# Patient Record
Sex: Male | Born: 1955
Health system: Southern US, Community
[De-identification: ages and names within clinical notes are randomized; demographics above are authoritative.]

## PROBLEM LIST (undated history)

## (undated) DIAGNOSIS — I10 Essential (primary) hypertension: Secondary | ICD-10-CM

## (undated) HISTORY — DX: Essential (primary) hypertension: I10

---

## 2005-04-26 ENCOUNTER — Ambulatory Visit: Payer: Self-pay | Admitting: Internal Medicine

## 2010-10-26 ENCOUNTER — Ambulatory Visit (INDEPENDENT_AMBULATORY_CARE_PROVIDER_SITE_OTHER): Payer: Managed Care, Other (non HMO) | Admitting: Internal Medicine

## 2010-10-26 ENCOUNTER — Encounter: Payer: Self-pay | Admitting: Internal Medicine

## 2010-10-26 DIAGNOSIS — M79606 Pain in leg, unspecified: Secondary | ICD-10-CM

## 2010-10-26 DIAGNOSIS — M79609 Pain in unspecified limb: Secondary | ICD-10-CM

## 2010-10-26 DIAGNOSIS — I10 Essential (primary) hypertension: Secondary | ICD-10-CM

## 2010-10-26 MED ORDER — DICLOFENAC POTASSIUM 50 MG PO TABS: ORAL_TABLET | ORAL | Status: DC

## 2010-10-26 MED ORDER — AMLODIPINE BESYLATE 5 MG PO TABS: 5.0000 mg | ORAL_TABLET | Freq: Every day | ORAL | Status: DC

## 2010-10-26 NOTE — Progress Notes (Signed)
  Subjective:    Patient ID: Jamie Sparks, male    DOB: May 30, 1955, 55 y.o.   MRN: [id number]  HPI Pt presents to clinic for evaluation of possible MSK injury. Is an avid cyclist and has had somewhat chronic intermittent pain located right upper hamstring area. No injury or trauma. Continues to cycle without pain but off exercise days has mild pain. Does not interfere in daily activities. Has attempted herbal medication to help with inflammation.  BP reviewed as elevated without ha, dizziness, cp or neurologic deficit. Has not required anti-htn medication in the past but has noted diastolics consistently in mid to upper 90's. Notes extensive family hx of HTN. No alleviating or exacerbating factors. No other complaints.  Reviewed pmh, psh, medications, allergies, soc hx and fam hx.    Review of Systems  Constitutional: Negative for fever, chills and fatigue.  Eyes: Negative for redness and itching.  Respiratory: Negative for cough and shortness of breath.   Cardiovascular: Negative for chest pain and palpitations.  Musculoskeletal: Positive for myalgias. Negative for back pain, arthralgias and gait problem.  Skin: Negative for color change and rash.  Neurological: Negative for dizziness, seizures, syncope, weakness and headaches.  All other systems reviewed and are negative.       Objective:   Physical Exam  Nursing note and vitals reviewed. Constitutional: He appears well-developed and well-nourished. No distress.  HENT:  Head: Normocephalic and atraumatic.  Right Ear: External ear normal.  Left Ear: External ear normal.  Nose: Nose normal.  Eyes: Conjunctivae are normal. Right eye exhibits no discharge. Left eye exhibits no discharge. No scleral icterus.  Neck: Neck supple. No JVD present.  Musculoskeletal:       FROM right hip. No reproduction of pain with extension/flexion, int/ext rotation. NT posterior gluteal and hamstring area. Gait nl  Neurological: He is alert.    Skin: Skin is warm and dry. No rash noted. He is not diaphoretic. No erythema.  Psychiatric: He has a normal mood and affect.          Assessment & Plan:

## 2010-10-26 NOTE — Assessment & Plan Note (Signed)
Attempt voltaren with food and no other nsaid. Recommend hiatus from cycling temporarily to facilitate healing. Consider PT or orthopedics if sx's persist.

## 2010-10-26 NOTE — Assessment & Plan Note (Signed)
Asx. ?phase shift. Recommend medication with norvasc 5mg  po qd. Monitor bp as outpt and schedule close follow up in clinic for re-evaluation. At follow up discuss labs.

## 2010-11-04 ENCOUNTER — Ambulatory Visit (INDEPENDENT_AMBULATORY_CARE_PROVIDER_SITE_OTHER): Payer: Managed Care, Other (non HMO) | Admitting: Internal Medicine

## 2010-11-04 ENCOUNTER — Encounter: Payer: Self-pay | Admitting: Internal Medicine

## 2010-11-04 VITALS — BP 180/120 | Temp 98.1°F | Wt 220.0 lb

## 2010-11-04 DIAGNOSIS — M79609 Pain in unspecified limb: Secondary | ICD-10-CM

## 2010-11-04 DIAGNOSIS — I10 Essential (primary) hypertension: Secondary | ICD-10-CM

## 2010-11-04 DIAGNOSIS — M79606 Pain in leg, unspecified: Secondary | ICD-10-CM

## 2010-11-04 LAB — CBC WITH DIFFERENTIAL/PLATELET
Basophils Relative: 0.5 % (ref 0.0–3.0)
Eosinophils Absolute: 0 10*3/uL (ref 0.0–0.7)
Hemoglobin: 14.1 g/dL (ref 13.0–17.0)
MCHC: 33.8 g/dL (ref 30.0–36.0)
MCV: 88.9 fl (ref 78.0–100.0)
Monocytes Absolute: 0.5 10*3/uL (ref 0.1–1.0)
Neutro Abs: 3.5 10*3/uL (ref 1.4–7.7)
RBC: 4.7 Mil/uL (ref 4.22–5.81)
RDW: 13.2 % (ref 11.5–14.6)

## 2010-11-04 LAB — POCT URINALYSIS DIPSTICK
Blood, UA: NEGATIVE
Nitrite, UA: NEGATIVE
Protein, UA: NEGATIVE
Urobilinogen, UA: 0.2
pH, UA: 7

## 2010-11-04 LAB — TSH: TSH: 1.22 u[IU]/mL (ref 0.35–5.50)

## 2010-11-04 LAB — BASIC METABOLIC PANEL
BUN: 17 mg/dL (ref 6–23)
Calcium: 9.1 mg/dL (ref 8.4–10.5)
Creatinine, Ser: 0.8 mg/dL (ref 0.4–1.5)
GFR: 107.7 mL/min (ref >60.00)

## 2010-11-04 MED ORDER — AMLODIPINE BESY-BENAZEPRIL HCL 10-20 MG PO CAPS: 1.0000 | ORAL_CAPSULE | Freq: Every day | ORAL | Status: DC

## 2010-11-06 NOTE — Progress Notes (Signed)
  Subjective:    Patient ID: Jamie Sparks, male    DOB: 1955-05-15, 55 y.o.   MRN: [id number]  HPI Patient presents to clinic for evaluation of elevated blood pressure. Seen recently with musculoskeletal pain located low back versus upper hamstring. Occurs with exercise as he is an avid cyclist. Attempted anti-inflammatory and stop cycling temporarily. Pain resolved after resuming cycling has resumed the pain mildly. At last visit blood pressure was noted to be significant elevated however exam also complicated by a phase shift. Recalls elevated blood pressures consistent hypertension for many years. Began Norvasc 5 mg at last visit and tolerates without lower extremity swelling. Home monitoring initially demonstrated normotensive numbers such as 125/80 however quickly returned to elevated pressures of approximately 180/102. Continues to remain asymptomatic without headache, dizziness, neurologic deficit chest pain or shortness of breath. No exacerbating or alleviating factors. No other complaints.     Review of Systems  Constitutional: Negative for activity change, fatigue and unexpected weight change.  Respiratory: Negative for cough and shortness of breath.   Cardiovascular: Negative for chest pain and palpitations.  Neurological: Negative for dizziness, weakness and headaches.       Objective:   Physical Exam  Nursing note and vitals reviewed. Constitutional: He appears well-developed and well-nourished. No distress.  HENT:  Head: Normocephalic and atraumatic.  Eyes: Conjunctivae are normal. No scleral icterus.  Neurological: He is alert.  Skin: Skin is warm and dry. He is not diaphoretic.  Psychiatric: He has a normal mood and affect.          Assessment & Plan:

## 2010-11-06 NOTE — Assessment & Plan Note (Signed)
Improved with reduction of exercise. Discussed physical therapy and patient states he will consider.

## 2010-11-06 NOTE — Assessment & Plan Note (Signed)
Rechecked myself 180/100. Remains with poor control. Change Norvasc to Lotrel. Obtain CBC, Chem-7, TSH. Schedule close followup in 2 weeks or sooner if necessary. Consider secondary workup if remains under poor control.

## 2010-11-08 ENCOUNTER — Telehealth: Payer: Self-pay

## 2010-11-08 NOTE — Telephone Encounter (Signed)
Left message on personally identified voicemail to notify pt labs nl

## 2010-11-08 NOTE — Telephone Encounter (Signed)
Message copied by Beverely Low on Tue Nov 08, 2010  4:08 PM ------      Message from: Staci Righter      Created: Tue Nov 08, 2010  8:25 AM       Labs nl

## 2010-11-14 ENCOUNTER — Encounter: Payer: Self-pay | Admitting: Internal Medicine

## 2010-11-14 ENCOUNTER — Ambulatory Visit (INDEPENDENT_AMBULATORY_CARE_PROVIDER_SITE_OTHER): Payer: Managed Care, Other (non HMO) | Admitting: Internal Medicine

## 2010-11-14 DIAGNOSIS — I10 Essential (primary) hypertension: Secondary | ICD-10-CM

## 2010-11-14 MED ORDER — AMLODIPINE BESY-BENAZEPRIL HCL 10-40 MG PO CAPS: 1.0000 | ORAL_CAPSULE | Freq: Every day | ORAL | Status: DC

## 2010-11-14 NOTE — Progress Notes (Signed)
  Subjective:    Patient ID: Jamie Sparks, male    DOB: 12/12/1955, 55 y.o.   MRN: [id number]  HPI Pt presents to clinic for followup of hypertension. After initial lack of improvement with Norvasc patient was changed to Lotrel. Has only slight nonproductive cough without lip swelling. Overall feels improved. Is compliant with medication. Notes recent blood pressure of 140/85. Blood pressure elevated today without symptoms of headache dizziness chest pain or shortness of breath. Rechecked to be 160/96 right arm. No leg swelling. No other complaints  Reviewed past medical history, medications and allergies.  Review of Systems see history of present illness     Objective:   Physical Exam  Nursing note and vitals reviewed. Constitutional: He appears well-developed and well-nourished.  HENT:  Head: Normocephalic and atraumatic.  Eyes: Conjunctivae are normal. No scleral icterus.  Neurological: He is alert.  Skin: Skin is warm and dry.  Psychiatric: He has a normal mood and affect.          Assessment & Plan:

## 2010-11-14 NOTE — Assessment & Plan Note (Signed)
Improved but suboptimal control. Increase Lotrel 10/40 milligrams by mouth daily. Discussed Chem-7 and patient defers until next visit. Followup in one month or sooner if necessary. Continue outpatient monitoring of blood pressure

## 2010-12-19 ENCOUNTER — Encounter: Payer: Self-pay | Admitting: Internal Medicine

## 2010-12-19 ENCOUNTER — Ambulatory Visit (INDEPENDENT_AMBULATORY_CARE_PROVIDER_SITE_OTHER): Payer: Managed Care, Other (non HMO) | Admitting: Internal Medicine

## 2010-12-19 VITALS — BP 132/80 | HR 58 | Temp 98.0°F | Resp 16 | Wt 221.0 lb

## 2010-12-19 DIAGNOSIS — Z79899 Other long term (current) drug therapy: Secondary | ICD-10-CM

## 2010-12-19 DIAGNOSIS — M79609 Pain in unspecified limb: Secondary | ICD-10-CM

## 2010-12-19 DIAGNOSIS — I10 Essential (primary) hypertension: Secondary | ICD-10-CM

## 2010-12-19 DIAGNOSIS — M79606 Pain in leg, unspecified: Secondary | ICD-10-CM

## 2010-12-19 LAB — BASIC METABOLIC PANEL
BUN: 19 mg/dL (ref 6–23)
Chloride: 106 mEq/L (ref 96–112)
Creat: 1.03 mg/dL (ref 0.50–1.35)

## 2010-12-19 MED ORDER — DICLOFENAC SODIUM 75 MG PO TBEC: 75.0000 mg | DELAYED_RELEASE_TABLET | Freq: Two times a day (BID) | ORAL | Status: DC | PRN

## 2010-12-21 NOTE — Progress Notes (Signed)
  Subjective:    Patient ID: Jamie Sparks, male    DOB: May 30, 1955, 55 y.o.   MRN: [id number]  HPI patient presents to clinic for followup of hypertension. Attempted higher dose of Lotrel but developed nausea and upset stomach. Self decreased the dose back to 10/20 and side effects resolved. Blood pressure has been normotensive since that time. Has intermittent right low back pain that occurs with regular exercise. No radicular symptoms. Is much improved from initial evaluation. Helped by anti-inflammatory and is interested in possible higher dose. No abdominal pain or GI upset. No other complaints.  Reviewed past medical history, medications and allergies    Review of Systems see history of present illness     Objective:   Physical Exam  Nursing note and vitals reviewed. Constitutional: He appears well-developed and well-nourished. No distress.  HENT:  Head: Normocephalic and atraumatic.  Right Ear: External ear normal.  Left Ear: External ear normal.  Eyes: Conjunctivae are normal. No scleral icterus.  Cardiovascular: Normal rate, regular rhythm and normal heart sounds.  Exam reveals no gallop and no friction rub.   No murmur heard. Pulmonary/Chest: Effort normal and breath sounds normal. No respiratory distress. He has no wheezes. He has no rales.  Neurological: He is alert.  Skin: Skin is warm and dry. He is not diaphoretic.  Psychiatric: He has a normal mood and affect.          Assessment & Plan:

## 2010-12-21 NOTE — Assessment & Plan Note (Signed)
Normotensive. Continue current regimen. Obtain Chem-7

## 2010-12-21 NOTE — Assessment & Plan Note (Signed)
Increase dose of Voltaren. Take with food and no other anti-inflammatories as needed.

## 2011-01-27 ENCOUNTER — Encounter: Payer: Self-pay | Admitting: Family

## 2011-01-27 ENCOUNTER — Ambulatory Visit (INDEPENDENT_AMBULATORY_CARE_PROVIDER_SITE_OTHER): Payer: Managed Care, Other (non HMO) | Admitting: Family

## 2011-01-27 VITALS — BP 168/98 | HR 60 | Temp 97.9°F | Resp 16 | Ht 72.01 in | Wt 226.0 lb

## 2011-01-27 DIAGNOSIS — R011 Cardiac murmur, unspecified: Secondary | ICD-10-CM

## 2011-01-27 DIAGNOSIS — M79609 Pain in unspecified limb: Secondary | ICD-10-CM

## 2011-01-27 DIAGNOSIS — M79606 Pain in leg, unspecified: Secondary | ICD-10-CM

## 2011-01-27 DIAGNOSIS — I10 Essential (primary) hypertension: Secondary | ICD-10-CM

## 2011-01-27 MED ORDER — MELOXICAM 7.5 MG PO TABS: 7.5000 mg | ORAL_TABLET | Freq: Every day | ORAL | Status: DC

## 2011-01-27 NOTE — Progress Notes (Signed)
  Subjective:    Patient ID: Jamie Sparks, male    DOB: 1956-01-18, 55 y.o.   MRN: [id number]  HPI  Mr.  Jamie Sparks is a 55 yr old male who presents today with chief complaint of pain in the right hamstring. He is unable to tell me when this pain started, but he does tell me that he had some low back pain in the spring which has resolved. Reports that he rode 35 miles on his bike earlier this week. This is not unusual for him.  Heating pad improves his pain but he notes that after he gets up and moves around it "stiffens up." Denies previous history of the similar symptoms.    HTN- he reports that he has been checking his blood pressure regularly at home. It has reportedly been running 130-s 140's systolic. He reports that he took his lotrel today. Review of Systems  Respiratory: Negative for shortness of breath.   Cardiovascular: Negative for chest pain and leg swelling.   See HPI  Past Medical History  Diagnosis Date  . Hypertension     History   Social History  . Marital Status: Married    Spouse Name: N/A    Number of Children: N/A  . Years of Education: N/A   Occupational History  . Not on file.   Social History Main Topics  . Smoking status: Never Smoker   . Smokeless tobacco: Not on file  . Alcohol Use: Yes  . Drug Use: No  . Sexually Active:    Other Topics Concern  . Not on file   Social History Narrative  . No narrative on file    No past surgical history on file.  Family History  Problem Relation Age of Onset  . Hypertension Mother     No Known Allergies  Current Outpatient Prescriptions on File Prior to Visit  Medication Sig Dispense Refill  . amLODipine-benazepril (LOTREL) 10-20 MG per capsule Take 1 capsule by mouth daily.          BP 168/98  Pulse 60  Temp(Src) 97.9 F (36.6 C) (Oral)  Resp 16  Ht 6' 0.01" (1.829 m)  Wt 226 lb (102.513 kg)  BMI 30.64 kg/m2       Objective:   Physical Exam  Constitutional: He appears  well-developed and well-nourished.  Cardiovascular: Normal rate and regular rhythm.        Grade II/VI systolic high pitched murmur   Pulmonary/Chest: Effort normal and breath sounds normal. No respiratory distress. He has no wheezes. He has no rales. He exhibits no tenderness.  Musculoskeletal: Normal range of motion. He exhibits no edema.       Right upper leg: He exhibits tenderness. He exhibits no swelling and no edema.  Neurological: He exhibits normal muscle tone.          Assessment & Plan:   BP Readings from Last 3 Encounters:  01/27/11 168/98  12/19/10 132/80  11/14/10 152/98

## 2011-01-27 NOTE — Assessment & Plan Note (Signed)
New, sounds like murmur of AS. Will send for 2-D echo to further evaluate. Pt is clinically compensated.

## 2011-01-27 NOTE — Assessment & Plan Note (Signed)
I will refer to Dr. Pearletha [facility] sports medicine for further evaluation. Trial of Mobic in the meantime.

## 2011-01-27 NOTE — Patient Instructions (Addendum)
You will be contacted about your echocardiogram by cardiology and sports medicine. Call if you develop readings >150/90. Follow up in 1 month.

## 2011-01-27 NOTE — Assessment & Plan Note (Addendum)
Deteriorated- 130/73, 140/80's last week. I suggested that we add an additional agent to his regimen. He refuses.  I did repeat his BP in the office and confirmed the reading as listed.  I advised him to check his BP daily at home and call if >150/90 and to follow up with Dr. Rodena Medin in 1 month.

## 2011-02-01 ENCOUNTER — Ambulatory Visit (INDEPENDENT_AMBULATORY_CARE_PROVIDER_SITE_OTHER): Payer: Managed Care, Other (non HMO) | Admitting: Family Medicine

## 2011-02-01 VITALS — BP 151/98 | Ht 72.0 in | Wt 205.0 lb

## 2011-02-01 DIAGNOSIS — M549 Dorsalgia, unspecified: Secondary | ICD-10-CM

## 2011-02-01 DIAGNOSIS — M5416 Radiculopathy, lumbar region: Secondary | ICD-10-CM

## 2011-02-01 DIAGNOSIS — IMO0002 Reserved for concepts with insufficient information to code with codable children: Secondary | ICD-10-CM

## 2011-02-01 MED ORDER — PREDNISONE (PAK) 10 MG PO TABS: 10.0000 mg | ORAL_TABLET | Freq: Every day | ORAL | Status: AC

## 2011-02-01 NOTE — Patient Instructions (Signed)
Your symptoms indicate hamstring strain/spasms and possible lumbar radiculopathy (an irritated nerve in your low back). A prednisone dose pack is the best option for immediate relief and may be prescribed with transition to an anti-inflammatory like aleve (2 tabs twice a day with food) or meloxicam (if you do not have stomach or kidney issues). Stay as active as possible. Physical therapy has been shown to be helpful as well - start this for strengthening of hamstring and core muscles, home exercise program, modalities. Strengthening of low back muscles, abdominal musculature are key for long term pain relief. Ok to continue to ride while you are getting over this - if pain is > 3/10 while riding, you should back off. Be cognizant of your posture when you are on the bike. If not improving, will consider further imaging (x-rays and MRI) of lumbar spine.

## 2011-02-02 ENCOUNTER — Encounter: Payer: Self-pay | Admitting: Family Medicine

## 2011-02-02 DIAGNOSIS — M549 Dorsalgia, unspecified: Secondary | ICD-10-CM | POA: Insufficient documentation

## 2011-02-02 NOTE — Progress Notes (Signed)
  Subjective:    Patient ID: Jamie Sparks, male    DOB: February 25, 1956, 55 y.o.   MRN: [id number]  PCP: Dr. Rodena Medin  HPI 55 yo M here for right hamstring/back pain.  Patient reports starting in May he bought a new road bike (is a cyclist) and shortly after using this on 25-30 mile rides he developed mild right sided low back pain. No associated numbness or tingling. Didn't radiate down leg at the time. Over several weeks began to develop posterolateral right leg pain in hamstring and lateral leg pain sometimes going down past knee. Has been using heat and ice, mobic for pain/inflammation. Has not done any specific PT. No prior workup of back or leg issues. Feels like he gets a knot in hamstring that eases throughout the day and throughout his rides. No bowel/bladder dysfunction.  Past Medical History  Diagnosis Date  . Hypertension     Current Outpatient Prescriptions on File Prior to Visit  Medication Sig Dispense Refill  . amLODipine-benazepril (LOTREL) 10-20 MG per capsule Take 1 capsule by mouth daily.        . meloxicam (MOBIC) 7.5 MG tablet Take 1 tablet (7.5 mg total) by mouth daily.  15 tablet  0    History reviewed. No pertinent past surgical history.  No Known Allergies  History   Social History  . Marital Status: Married    Spouse Name: N/A    Number of Children: N/A  . Years of Education: N/A   Occupational History  . Not on file.   Social History Main Topics  . Smoking status: Never Smoker   . Smokeless tobacco: Not on file  . Alcohol Use: Yes  . Drug Use: No  . Sexually Active:    Other Topics Concern  . Not on file   Social History Narrative  . No narrative on file    Family History  Problem Relation Age of Onset  . Hypertension Mother     BP 151/98  Ht 6' (1.829 m)  Wt 205 lb (92.987 kg)  BMI 27.80 kg/m2  Review of Systems See HPI above.    Objective:   Physical Exam Gen: NAD  Back/R leg: No gross deformity, scoliosis,  defect/atrophy. No paraspinal TTP .  No midline or bony TTP.  Mod TTP distal lateral hamstring without palpable defect.  Mild TTP proximal lateral hamstring also. FROM without pain in back. Strength LEs 4/5 with right hip abduction and 4/5 with pain on hamstring testing (knee flexion at 90 degrees).  5/5 all othermuscle groups.   2+ MSRs in patellar tendons, 1+ achilles tendons, equal bilaterally. Negative SLRs. Sensation intact to light touch bilaterally. Negative logroll bilateral hips Negative fabers and piriformis stretches. No leg length inequality    Assessment & Plan:  1. Low back/right leg pain - currently without any back pain and back exam is normal.  He does have weakness in hip abduction and hamstring flexion that could be related to irritated lumbar nerve or hamstring strain/weakness from overuse.  Start PT for both issues.  Will trial prednisone dose pack then switch back to meloxicam.  Ok to cycle in meantime unless pain is > 3/10 but tends to improve with activity.  F/u in 4-6 weeks for recheck.  If not improving as expected, would consider MRI of lumbar spine.  Of note he has also gone for bike fitting given this was associated with a new bike.

## 2011-02-02 NOTE — Assessment & Plan Note (Signed)
currently without any back pain and back exam is normal.  He does have weakness in hip abduction and hamstring flexion that could be related to irritated lumbar nerve or hamstring strain/weakness from overuse.  Start PT for both issues.  Will trial prednisone dose pack then switch back to meloxicam.  Ok to cycle in meantime unless pain is > 3/10 but tends to improve with activity.  F/u in 4-6 weeks for recheck.  If not improving as expected, would consider MRI of lumbar spine.  Of note he has also gone for bike fitting given this was associated with a new bike.

## 2011-02-06 ENCOUNTER — Ambulatory Visit: Payer: Managed Care, Other (non HMO) | Admitting: Family Medicine

## 2011-02-07 ENCOUNTER — Telehealth: Payer: Self-pay | Admitting: Family

## 2011-02-07 NOTE — Telephone Encounter (Signed)
Clarification: he does not want this apt?

## 2011-02-07 NOTE — Telephone Encounter (Signed)
Call to patient  With Echo appt.   Patient states he does not this appt   He will see his other doctor next week .

## 2011-02-08 ENCOUNTER — Telehealth: Payer: Self-pay | Admitting: Family

## 2011-02-08 NOTE — Telephone Encounter (Signed)
Received flag from cardiology that pt has cancelled the echo.

## 2011-02-21 ENCOUNTER — Other Ambulatory Visit (HOSPITAL_COMMUNITY): Payer: Managed Care, Other (non HMO)

## 2011-03-01 ENCOUNTER — Encounter: Payer: Self-pay | Admitting: Family Medicine

## 2011-03-01 ENCOUNTER — Ambulatory Visit (INDEPENDENT_AMBULATORY_CARE_PROVIDER_SITE_OTHER): Payer: Managed Care, Other (non HMO) | Admitting: Family Medicine

## 2011-03-01 VITALS — BP 157/97 | HR 66 | Temp 97.6°F | Ht 72.0 in | Wt 205.0 lb

## 2011-03-01 DIAGNOSIS — M549 Dorsalgia, unspecified: Secondary | ICD-10-CM

## 2011-03-01 NOTE — Assessment & Plan Note (Signed)
2/2 lumbar radiculopathy - significant improvement with prednisone and his own exercise program - marked improvement in exam as well.  Continue with home exercises regularly for 2 more weeks then back down to 3 days a week.  Aleve as needed.  F/u prn.

## 2011-03-01 NOTE — Progress Notes (Signed)
Subjective:    Patient ID: Jamie Sparks, male    DOB: 02-15-1956, 55 y.o.   MRN: [id number]  PCP: Dr. Rodena Medin  HPI  55 yo M here for f/u right hamstring/back pain.  9/26: Patient reports starting in May he bought a new road bike (is a cyclist) and shortly after using this on 25-30 mile rides he developed mild right sided low back pain. No associated numbness or tingling. Didn't radiate down leg at the time. Over several weeks began to develop posterolateral right leg pain in hamstring and lateral leg pain sometimes going down past knee. Has been using heat and ice, mobic for pain/inflammation. Has not done any specific PT. No prior workup of back or leg issues. Feels like he gets a knot in hamstring that eases throughout the day and throughout his rides. No bowel/bladder dysfunction.  10/24: Patient took prednisone dose pack and had significant improvement in symptoms. Did not go to physical therapy but has been really focusing on lower extremity strengthening, especially of right hamstring and hip abduction where he had deficits at last visit. Took some aleve last week. Reports no pain currently and 100% improved. Now only with occasional symptoms that he notes go through lateral right leg without numbness/tingling. No other concerns. Able to cycle for 20+ miles now without pain.  Past Medical History  Diagnosis Date  . Hypertension     Current Outpatient Prescriptions on File Prior to Visit  Medication Sig Dispense Refill  . amLODipine-benazepril (LOTREL) 10-20 MG per capsule Take 1 capsule by mouth daily.        . meloxicam (MOBIC) 7.5 MG tablet Take 1 tablet (7.5 mg total) by mouth daily.  15 tablet  0    History reviewed. No pertinent past surgical history.  No Known Allergies  History   Social History  . Marital Status: Married    Spouse Name: N/A    Number of Children: N/A  . Years of Education: N/A   Occupational History  . Not on file.   Social  History Main Topics  . Smoking status: Never Smoker   . Smokeless tobacco: Not on file  . Alcohol Use: Yes  . Drug Use: No  . Sexually Active: Not on file   Other Topics Concern  . Not on file   Social History Narrative  . No narrative on file    Family History  Problem Relation Age of Onset  . Hypertension Mother     BP 157/97  Pulse 66  Temp(Src) 97.6 F (36.4 C) (Oral)  Ht 6' (1.829 m)  Wt 205 lb (92.987 kg)  BMI 27.80 kg/m2  Review of Systems  See HPI above.    Objective:   Physical Exam  Gen: NAD  Back/R leg: No gross deformity, scoliosis, defect/atrophy. No paraspinal TTP .  No midline or bony TTP.  No longer tender in hamstring. FROM without pain in back. Strength 5/5 with all lower extremity muscle testing including hip abduction and knee flexion.   2+ MSRs in patellar tendons, 1+ achilles tendons, equal bilaterally. Negative SLRs. Sensation intact to light touch bilaterally. Negative logroll bilateral hips Negative fabers and piriformis stretches.    Assessment & Plan:  1. Low back/right leg pain - 2/2 lumbar radiculopathy - significant improvement with prednisone and his own exercise program - marked improvement in exam as well.  Continue with home exercises regularly for 2 more weeks then back down to 3 days a week.  Aleve as needed.  F/u prn.

## 2011-03-20 ENCOUNTER — Ambulatory Visit: Payer: Managed Care, Other (non HMO) | Admitting: Internal Medicine

## 2011-04-21 ENCOUNTER — Encounter: Payer: Self-pay | Admitting: Internal Medicine

## 2011-04-21 ENCOUNTER — Ambulatory Visit (INDEPENDENT_AMBULATORY_CARE_PROVIDER_SITE_OTHER): Payer: Managed Care, Other (non HMO) | Admitting: Internal Medicine

## 2011-04-21 VITALS — BP 132/90 | HR 74 | Temp 98.1°F | Resp 18 | Wt 229.0 lb

## 2011-04-21 DIAGNOSIS — I10 Essential (primary) hypertension: Secondary | ICD-10-CM

## 2011-04-21 DIAGNOSIS — R011 Cardiac murmur, unspecified: Secondary | ICD-10-CM

## 2011-04-21 NOTE — Patient Instructions (Signed)
Please schedule chem7 (v58.69) and lipid (401.9) prior to next visit

## 2011-04-22 NOTE — Assessment & Plan Note (Signed)
Asx. Do not recall 3/6 sm on previous exams.recommend echocardiogram.

## 2011-04-22 NOTE — Progress Notes (Signed)
  Subjective:    Patient ID: Jamie Sparks, male    DOB: [birth year], 55 y.o.   MRN: [id number]  HPI Pt presents to clinic for followup of multiple medical problems. Notes home BP under avg control. Tolerates current dose of lotrel without leg swelling or cough.   Past Medical History  Diagnosis Date  . Hypertension    No past surgical history on file.  reports that he has never smoked. He has never used smokeless tobacco. He reports that he drinks alcohol. He reports that he does not use illicit drugs. family history includes Hypertension in his mother. No Known Allergies    Review of Systems see hpi     Objective:   Physical Exam  Nursing note and vitals reviewed. Constitutional: He appears well-developed and well-nourished. No distress.  HENT:  Head: Normocephalic and atraumatic.  Right Ear: External ear normal.  Left Ear: External ear normal.  Eyes: Conjunctivae are normal. No scleral icterus.  Cardiovascular: Normal rate and regular rhythm.  Exam reveals no gallop and no friction rub.   Murmur heard.  Systolic murmur is present with a grade of 3/6  Neurological: He is alert.  Skin: He is not diaphoretic.  Psychiatric: He has a normal mood and affect.           Assessment & Plan:

## 2011-04-22 NOTE — Assessment & Plan Note (Signed)
avg control. Difficulty tolerating higher dose of lotrel. Plans to focus on wt loss. Obtain chem7 prior to next visit

## 2011-05-12 ENCOUNTER — Ambulatory Visit (HOSPITAL_COMMUNITY): Payer: Managed Care, Other (non HMO) | Attending: Cardiology | Admitting: Radiology

## 2011-05-12 DIAGNOSIS — R011 Cardiac murmur, unspecified: Secondary | ICD-10-CM | POA: Insufficient documentation

## 2011-05-12 DIAGNOSIS — I10 Essential (primary) hypertension: Secondary | ICD-10-CM | POA: Insufficient documentation

## 2011-06-12 ENCOUNTER — Telehealth: Payer: Self-pay | Admitting: Internal Medicine

## 2011-06-12 MED ORDER — AMLODIPINE BESY-BENAZEPRIL HCL 10-20 MG PO CAPS: 1.0000 | ORAL_CAPSULE | Freq: Every day | ORAL | Status: DC

## 2011-06-12 NOTE — Telephone Encounter (Signed)
Refill sent to pharmacy.   

## 2011-06-13 ENCOUNTER — Telehealth: Payer: Self-pay | Admitting: Internal Medicine

## 2011-06-13 NOTE — Telephone Encounter (Signed)
Refill completed yesterday. See previous documentation.

## 2011-06-22 ENCOUNTER — Encounter: Payer: Self-pay | Admitting: Family Medicine

## 2011-06-22 ENCOUNTER — Ambulatory Visit (INDEPENDENT_AMBULATORY_CARE_PROVIDER_SITE_OTHER): Payer: Managed Care, Other (non HMO) | Admitting: Family Medicine

## 2011-06-22 DIAGNOSIS — M25579 Pain in unspecified ankle and joints of unspecified foot: Secondary | ICD-10-CM

## 2011-06-22 DIAGNOSIS — M25551 Pain in right hip: Secondary | ICD-10-CM

## 2011-06-22 DIAGNOSIS — M25571 Pain in right ankle and joints of right foot: Secondary | ICD-10-CM

## 2011-06-22 DIAGNOSIS — M25559 Pain in unspecified hip: Secondary | ICD-10-CM

## 2011-06-22 NOTE — Patient Instructions (Signed)
Your ankle pain is due to peroneal tendinopathy - overuse/strain of the two muscles/tendons that are on the outside of the ankle. Start home exercise program - 3 sets of 10 calf raises and external rotation with theraband. Ice after activities for 15 minutes. Tylenol and/or aleve as needed for pain. Consider over-the-counter orthotics but they have to have a plastic or more firm arch support to them (these typically run about 15 bucks). If not improving can consider formal physical therapy or nitro patches for this.  Your right hip pain is within external rotators of your hip (glut medius, gemelli, obturator, quadratus). Do hip abduction and standing hip rotation exercises 3 sets of 10 once a day - add ankle weight if these become too easy. These are relatively difficult to isolate so I want PT to evaluate you and give you some additional exercises to get you better. Ice or heat as needed (whichever feels better to you). Tennis ball massage to area when possible and on long car rides. Follow up with me in 1 month to 6 weeks for both of these.  No restrictions on activities as a result of these though. I don't think these will be longstanding issues for you.

## 2011-06-23 ENCOUNTER — Encounter: Payer: Self-pay | Admitting: Family Medicine

## 2011-06-23 DIAGNOSIS — M25571 Pain in right ankle and joints of right foot: Secondary | ICD-10-CM | POA: Insufficient documentation

## 2011-06-23 DIAGNOSIS — M25551 Pain in right hip: Secondary | ICD-10-CM | POA: Insufficient documentation

## 2011-06-23 NOTE — Progress Notes (Signed)
Subjective:    Patient ID: Jamie Sparks, male    DOB: 1956/01/07, 56 y.o.   MRN: [id number]  PCP: Dr. Rodena Medin  Ankle Pain    56 yo M here for f/u right back and new c/o ankle pain.  9/26: Patient reports starting in May he bought a new road bike (is a cyclist) and shortly after using this on 25-30 mile rides he developed mild right sided low back pain. No associated numbness or tingling. Didn't radiate down leg at the time. Over several weeks began to develop posterolateral right leg pain in hamstring and lateral leg pain sometimes going down past knee. Has been using heat and ice, mobic for pain/inflammation. Has not done any specific PT. No prior workup of back or leg issues. Feels like he gets a knot in hamstring that eases throughout the day and throughout his rides. No bowel/bladder dysfunction.  10/24: Patient took prednisone dose pack and had significant improvement in symptoms. Did not go to physical therapy but has been really focusing on lower extremity strengthening, especially of right hamstring and hip abduction where he had deficits at last visit. Took some aleve last week. Reports no pain currently and 100% improved. Now only with occasional symptoms that he notes go through lateral right leg without numbness/tingling. No other concerns. Able to cycle for 20+ miles now without pain.  06/22/11: Patient reports his back pain, hamstring and lateral leg pain still is better. Now complains of lateral right ankle pain has had for 8 months but becoming more nagging. Able to do all activities but 1-2 days afterwards develops worsening pain lateral ankle. No swelling and no h/o trauma. Tried heat, diclofenac, stretches he learned online - these help some. He also reports a dull aching pain lateral right buttock/hip. No numbness/tingling or radiation. Feels stiff - loosens up after exercising and stretching.  Past Medical History  Diagnosis Date  . Hypertension      Current Outpatient Prescriptions on File Prior to Visit  Medication Sig Dispense Refill  . amLODipine-benazepril (LOTREL) 10-20 MG per capsule Take 1 capsule by mouth daily.  30 capsule  2    History reviewed. No pertinent past surgical history.  No Known Allergies  History   Social History  . Marital Status: Married    Spouse Name: N/A    Number of Children: N/A  . Years of Education: N/A   Occupational History  . Not on file.   Social History Main Topics  . Smoking status: Never Smoker   . Smokeless tobacco: Never Used  . Alcohol Use: Yes  . Drug Use: No  . Sexually Active: Not on file   Other Topics Concern  . Not on file   Social History Narrative  . No narrative on file    Family History  Problem Relation Age of Onset  . Hypertension Mother     BP 171/83  Pulse 66  Temp(Src) 97.6 F (36.4 C) (Oral)  Ht 6' (1.829 m)  Wt 220 lb (99.791 kg)  BMI 29.84 kg/m2  Review of Systems  See HPI above.    Objective:   Physical Exam  Gen: NAD  Back/R leg: No gross deformity, scoliosis, defect/atrophy. No paraspinal TTP.  No midline or bony TTP.  Mild TTP just posterior to right greater trochanter within external rotators. FROM without pain in back. Strength 5/5 with all lower extremity muscle testing including hip abduction and knee flexion, ext rotation grossly. Negative SLRs. Negative logroll bilateral hips  R  ankle: No gross deformity, swelling, ecchymoses FROM without pain, 5/5 strength all directions. TTP peroneal tendons as they cross ankle.  No TTP malleoli, base 5th, navicular, elsewhere about foot/ankle. Negative ant drawer and talar tilt.   Negative syndesmotic compression. Thompsons test negative. NV intact distally.     Assessment & Plan:  1. Right hip pain - Pain suggestive of hip external rotator weakness that likely comes out with prolonged activities as his gross strength is excellent.  Will start physical therapy for 1 visit to  learn additional stretches/exercises to do at home.  Start hip abduction, standing rotations now.  Ice/heat as needed, tennis ball massage for spasms.  2. Right ankle pain - 2/2 peroneal tendinopathy - home exercise program demonstrated with flexion, ext rotation exercises.  Ice, tylenol/aleve as needed.  Try OTC orthotics - we can try ours if he does not find sufficient ones.  Consider PT or nitro patches if not improving as expected.

## 2011-06-23 NOTE — Assessment & Plan Note (Signed)
Pain suggestive of hip external rotator weakness that likely comes out with prolonged activities as his gross strength is excellent.  Will start physical therapy for 1 visit to learn additional stretches/exercises to do at home.  Start hip abduction, standing rotations now.  Ice/heat as needed, tennis ball massage for spasms.

## 2011-06-23 NOTE — Assessment & Plan Note (Signed)
2/2 peroneal tendinopathy - home exercise program demonstrated with flexion, ext rotation exercises.  Ice, tylenol/aleve as needed.  Try OTC orthotics - we can try ours if he does not find sufficient ones.  Consider PT or nitro patches if not improving as expected.

## 2011-06-27 ENCOUNTER — Ambulatory Visit: Payer: Managed Care, Other (non HMO) | Attending: Family Medicine | Admitting: Physical Therapy

## 2011-06-27 DIAGNOSIS — M25659 Stiffness of unspecified hip, not elsewhere classified: Secondary | ICD-10-CM | POA: Insufficient documentation

## 2011-06-27 DIAGNOSIS — M25559 Pain in unspecified hip: Secondary | ICD-10-CM | POA: Insufficient documentation

## 2011-06-27 DIAGNOSIS — IMO0001 Reserved for inherently not codable concepts without codable children: Secondary | ICD-10-CM | POA: Insufficient documentation

## 2011-07-17 ENCOUNTER — Other Ambulatory Visit: Payer: Self-pay | Admitting: Family Medicine

## 2011-07-17 ENCOUNTER — Encounter: Payer: Self-pay | Admitting: Family Medicine

## 2011-07-27 ENCOUNTER — Ambulatory Visit: Payer: Managed Care, Other (non HMO) | Admitting: Family Medicine

## 2011-08-14 ENCOUNTER — Ambulatory Visit: Payer: Managed Care, Other (non HMO) | Admitting: Internal Medicine

## 2011-09-04 ENCOUNTER — Other Ambulatory Visit: Payer: Self-pay | Admitting: Internal Medicine

## 2011-09-04 NOTE — Telephone Encounter (Signed)
Rx refill sent to pharmacy. 

## 2011-09-21 ENCOUNTER — Ambulatory Visit (INDEPENDENT_AMBULATORY_CARE_PROVIDER_SITE_OTHER): Payer: Managed Care, Other (non HMO) | Admitting: Internal Medicine

## 2011-09-21 ENCOUNTER — Encounter: Payer: Self-pay | Admitting: Internal Medicine

## 2011-09-21 VITALS — BP 112/80 | HR 62 | Temp 97.6°F | Resp 20

## 2011-09-21 DIAGNOSIS — J329 Chronic sinusitis, unspecified: Secondary | ICD-10-CM

## 2011-09-21 DIAGNOSIS — I10 Essential (primary) hypertension: Secondary | ICD-10-CM

## 2011-09-21 MED ORDER — FLUTICASONE PROPIONATE 50 MCG/ACT NA SUSP: 2.0000 | Freq: Every day | NASAL | Status: DC

## 2011-09-21 MED ORDER — AMOXICILLIN-POT CLAVULANATE 875-125 MG PO TABS: 1.0000 | ORAL_TABLET | Freq: Two times a day (BID) | ORAL | Status: AC

## 2011-09-21 NOTE — Assessment & Plan Note (Signed)
bp normotensive today while sick. Resume bp monitoring and report low bp's. May require downward titration of bp med dose.

## 2011-09-21 NOTE — Progress Notes (Signed)
  Subjective:    Patient ID: MONTFORD BARG, male    DOB: 07/05/1955, 56 y.o.   MRN: [id number]  HPI Pt presents to clinic for evaluation of possible sinusitis. Notes over one week h/o left maxillary pain/pressure with nasal congestion and np cough. +upper left teeth pain. No f/c. Taking mucinex without significant improvement. No other alleviating or exacerbating factors. Notes prior to getting sick his blood pressure was declining with occasional sbp 105. Has increased his bike exercise distance and his weight has decreased.   Past Medical History  Diagnosis Date  . Hypertension    No past surgical history on file.  reports that he has never smoked. He has never used smokeless tobacco. He reports that he drinks alcohol. He reports that he does not use illicit drugs. family history includes Hypertension in his mother. No Known Allergies   Review of Systems see hpi     Objective:   Physical Exam  Nursing note and vitals reviewed. Constitutional: He appears well-developed and well-nourished. No distress.  HENT:  Head: Normocephalic and atraumatic.  Right Ear: External ear normal.  Left Ear: External ear normal.  Nose: Right sinus exhibits no maxillary sinus tenderness and no frontal sinus tenderness. Left sinus exhibits maxillary sinus tenderness. Left sinus exhibits no frontal sinus tenderness.  Mouth/Throat: Oropharynx is clear and moist. No oropharyngeal exudate.  Eyes: Conjunctivae and EOM are normal. Right eye exhibits no discharge. Left eye exhibits no discharge. No scleral icterus.  Neck: Neck supple.  Pulmonary/Chest: Effort normal and breath sounds normal. No respiratory distress. He has no wheezes. He has no rales.  Neurological: He is alert.  Skin: Skin is warm and dry. He is not diaphoretic.  Psychiatric: He has a normal mood and affect.          Assessment & Plan:

## 2011-09-21 NOTE — Assessment & Plan Note (Signed)
Begin course of augmentin and flonase. Followup if no improvement or worsening.

## 2011-12-20 ENCOUNTER — Other Ambulatory Visit: Payer: Self-pay | Admitting: Internal Medicine

## 2012-03-31 ENCOUNTER — Other Ambulatory Visit: Payer: Self-pay | Admitting: Internal Medicine

## 2012-04-01 NOTE — Telephone Encounter (Signed)
Rx to pharmacy/[facility] 

## 2012-07-15 ENCOUNTER — Telehealth: Payer: Self-pay | Admitting: Internal Medicine

## 2012-07-15 MED ORDER — AMLODIPINE BESY-BENAZEPRIL HCL 10-20 MG PO CAPS: 1.0000 | ORAL_CAPSULE | Freq: Every day | ORAL | Status: DC

## 2012-07-15 NOTE — Telephone Encounter (Signed)
AMLODIPINE BESYLATE 20 MG TAKE 1 CAPSUL DAILY [facility] [address] [place]

## 2012-07-31 ENCOUNTER — Telehealth: Payer: Self-pay | Admitting: Internal Medicine

## 2012-07-31 NOTE — Telephone Encounter (Signed)
Notified pt's wife that [facility] was [facility]. She states that pt picked up Rx but pharmacy only gave him #30 x 1 refill. Advised her he will need to be seen before refills can be sent to mail order.  She states she will notify pt and have him contact us for appt.

## 2012-07-31 NOTE — Telephone Encounter (Signed)
Patients wife states that patient is now using [facility] for medications.   Please send new rx of amlodipine to [facility]. Patient is requesting a 90 day supply with 3 refills

## 2012-08-27 ENCOUNTER — Encounter: Payer: Self-pay | Admitting: Family

## 2012-08-27 ENCOUNTER — Ambulatory Visit (INDEPENDENT_AMBULATORY_CARE_PROVIDER_SITE_OTHER): Payer: BC Managed Care – PPO | Admitting: Family

## 2012-08-27 VITALS — BP 156/102 | HR 72 | Temp 98.6°F | Resp 16 | Wt 234.0 lb

## 2012-08-27 DIAGNOSIS — I10 Essential (primary) hypertension: Secondary | ICD-10-CM

## 2012-08-27 MED ORDER — AMLODIPINE BESY-BENAZEPRIL HCL 10-20 MG PO CAPS: 1.0000 | ORAL_CAPSULE | Freq: Every day | ORAL | Status: DC

## 2012-08-27 NOTE — Progress Notes (Signed)
  Subjective:    Patient ID: Jamie Sparks, male    DOB: 05-29-1955, 57 y.o.   MRN: [id number]  HPI  Jamie Sparks is a 57 yr old male who presents today for follow up of his blood pressure.   HTN- he denies CP/SOB/Swelling. Reports that he had trouble getting his medication refilled.  Pharmacy reports that he picked up a 90 day supply in march, but pt tells me this is not accurated.  Reports that he has only taken his BP meds about 30 of the last 90 days.   Review of Systems See HPI  Past Medical History  Diagnosis Date  . Hypertension     History   Social History  . Marital Status: Married    Spouse Name: N/A    Number of Children: N/A  . Years of Education: N/A   Occupational History  . Not on file.   Social History Main Topics  . Smoking status: Never Smoker   . Smokeless tobacco: Never Used  . Alcohol Use: Yes  . Drug Use: No  . Sexually Active: Not on file   Other Topics Concern  . Not on file   Social History Narrative  . No narrative on file    No past surgical history on file.  Family History  Problem Relation Age of Onset  . Hypertension Mother     No Known Allergies  No current outpatient prescriptions on file prior to visit.   No current facility-administered medications on file prior to visit.    BP 156/102  Pulse 72  Temp(Src) 98.6 F (37 C) (Oral)  Resp 16  Wt 234 lb (106.142 kg)  BMI 31.73 kg/m2  SpO2 99%       Objective:   Physical Exam  Constitutional: He is oriented to person, place, and time. He appears well-developed and well-nourished. No distress.  Cardiovascular: Normal rate and regular rhythm.   No murmur heard. Pulmonary/Chest: Effort normal and breath sounds normal. No respiratory distress. He has no wheezes. He has no rales. He exhibits no tenderness.  Neurological: He is alert and oriented to person, place, and time.  Psychiatric: He has a normal mood and affect. His behavior is normal. Judgment and  thought content normal.          Assessment & Plan:

## 2012-08-27 NOTE — Patient Instructions (Addendum)
Please complete lab work prior to leaving. Schedule fasting physical in 1 month. We will plan to repeat your blood pressure at that time.

## 2012-08-27 NOTE — Assessment & Plan Note (Signed)
Deteriorated.  Resume lotrel, obtain bmet.

## 2012-08-28 ENCOUNTER — Encounter: Payer: Self-pay | Admitting: Family

## 2012-08-28 LAB — BASIC METABOLIC PANEL
CO2: 28 mEq/L (ref 19–32)
Calcium: 9.2 mg/dL (ref 8.4–10.5)
Sodium: 138 mEq/L (ref 135–145)

## 2012-10-04 ENCOUNTER — Encounter: Payer: BC Managed Care – PPO | Admitting: Family

## 2019-02-10 ENCOUNTER — Other Ambulatory Visit: Payer: Self-pay

## 2019-02-11 ENCOUNTER — Encounter: Payer: Self-pay | Admitting: Family Medicine

## 2019-02-11 ENCOUNTER — Ambulatory Visit (INDEPENDENT_AMBULATORY_CARE_PROVIDER_SITE_OTHER): Payer: BC Managed Care – PPO | Admitting: Family Medicine

## 2019-02-11 VITALS — BP 152/100 | HR 62 | Temp 97.9°F | Ht 72.0 in | Wt 225.0 lb

## 2019-02-11 DIAGNOSIS — I10 Essential (primary) hypertension: Secondary | ICD-10-CM

## 2019-02-11 DIAGNOSIS — E66811 Obesity, class 1: Secondary | ICD-10-CM | POA: Insufficient documentation

## 2019-02-11 DIAGNOSIS — E669 Obesity, unspecified: Secondary | ICD-10-CM | POA: Diagnosis not present

## 2019-02-11 MED ORDER — AMLODIPINE BESYLATE 5 MG PO TABS: 5.0000 mg | ORAL_TABLET | Freq: Every day | ORAL | 3 refills | Status: DC

## 2019-02-11 MED ORDER — CLONIDINE HCL 0.1 MG PO TABS: 0.1000 mg | ORAL_TABLET | Freq: Two times a day (BID) | ORAL | 0 refills | Status: DC

## 2019-02-11 NOTE — Progress Notes (Signed)
Chief Complaint  Patient presents with  . New Patient (Initial Visit)  . Hypertension       New Patient Visit SUBJECTIVE: HPI: Jamie Sparks is an 63 y.o.male who is being seen for establishing care.   Hypertension Patient presents for hypertension follow up. He does monitor home blood pressures. Blood pressures ranging on average from 150-160's/90's. He is compliant with medications-clonidine 0.2 mg twice daily, hydrochlorothiazide 25 mg daily Patient has these side effects of medication: none He is usually adhering to a healthy diet overall. Exercise: walking, cycling, wt resistance exercise Does not do well with lisinopril +famhx of HTN in mom and both sisters  No Known Allergies  Past Medical History:  Diagnosis Date  . Hypertension    History reviewed. No pertinent surgical history. Family History  Problem Relation Age of Onset  . Hypertension Mother   . Hypertension Sister   . Hypertension Sister    No Known Allergies  Current Outpatient Medications:  .  clobetasol ointment (TEMOVATE) 8.54 %, Apply 1 application topically 2 (two) times daily., Disp: , Rfl:  .  co-enzyme Q-10 30 MG capsule, Take 30 mg by mouth 3 (three) times daily., Disp: , Rfl:  .  hydrochlorothiazide (HYDRODIURIL) 25 MG tablet, Take 25 mg by mouth daily., Disp: , Rfl:  .  Multiple Vitamin (MULTIVITAMIN) tablet, Take 1 tablet by mouth daily., Disp: , Rfl:  .  amLODipine (NORVASC) 5 MG tablet, Take 1 tablet (5 mg total) by mouth daily., Disp: 30 tablet, Rfl: 3 .  cloNIDine (CATAPRES) 0.1 MG tablet, Take 1 tablet (0.1 mg total) by mouth 2 (two) times daily., Disp: 30 tablet, Rfl: 0  ROS Cardiovascular: Denies chest pain  Respiratory: Denies dyspnea   OBJECTIVE: BP (!) 152/100 (BP Location: Left Arm, Patient Position: Sitting, Cuff Size: Normal)   Pulse 62   Temp 97.9 F (36.6 C) (Temporal)   Ht 6' (1.829 m)   Wt 225 lb (102.1 kg)   SpO2 98%   BMI 30.52 kg/m   Constitutional:  -  VS reviewed -  Well developed, well nourished, appears stated age -  No apparent distress  Psychiatric: -  Oriented to person, place, and time -  Memory intact -  Affect and mood normal -  Fluent conversation, good eye contact -  Judgment and insight age appropriate  Eye: -  Conjunctivae clear, no discharge -  Pupils symmetric, round, reactive to light  Cardiovascular: -  RRR -  2+ SEM heard loudest at aortic listening post -  No LE edema  Respiratory: -  Normal respiratory effort, no accessory muscle use, no retraction -  Breath sounds equal, no wheezes, no ronchi, no crackles  Neurological:  -  CN II - XII grossly intact -  Sensation grossly intact to light touch, equal bilaterally  Skin: -  No significant lesion on inspection -  Warm and dry to palpation   ASSESSMENT/PLAN: Essential hypertension - Plan: hydrochlorothiazide (HYDRODIURIL) 25 MG tablet, cloNIDine (CATAPRES) 0.1 MG tablet, amLODipine (NORVASC) 5 MG tablet  Obesity (BMI 30.0-34.9)  1-counseled on diet and exercise.  Continue hydrochlorothiazide, will start weaning down from clonidine.  He will take 0.1 mg twice daily for the next week and then come off of it.  Warned of rebound hypertension and headaches.  Sounds like this was started by his neurologist.  He had been on Lotrel in the past and did well, however after exercising his blood pressure normalized and he was taken off of it.  Due to this, I will add back amlodipine.  Continue checking blood pressures at home. 2-weight loss goal is 200 pounds to start with.  He has been cycling more and tends to get closer to this weight when he does so consistently. Patient should return in 2 weeks. The patient voiced understanding and agreement to the plan.   Jilda Roche [place], DO 02/11/19  11:57 AM

## 2019-02-11 NOTE — Patient Instructions (Signed)
Keep the diet clean and stay active.  Keep checking your blood pressures at home.  We are weaning off of the clonidine. You have headaches and higher blood pressures.  Let us know if you need anything.

## 2019-02-25 ENCOUNTER — Encounter: Payer: Self-pay | Admitting: Family Medicine

## 2019-02-25 ENCOUNTER — Ambulatory Visit (INDEPENDENT_AMBULATORY_CARE_PROVIDER_SITE_OTHER): Payer: BC Managed Care – PPO | Admitting: Family Medicine

## 2019-02-25 ENCOUNTER — Other Ambulatory Visit: Payer: Self-pay

## 2019-02-25 VITALS — BP 158/100 | HR 92 | Temp 98.0°F | Ht 72.0 in | Wt 220.1 lb

## 2019-02-25 DIAGNOSIS — I10 Essential (primary) hypertension: Secondary | ICD-10-CM | POA: Diagnosis not present

## 2019-02-25 DIAGNOSIS — L309 Dermatitis, unspecified: Secondary | ICD-10-CM

## 2019-02-25 MED ORDER — CLOBETASOL PROPIONATE 0.05 % EX OINT: 1.0000 "application " | TOPICAL_OINTMENT | Freq: Two times a day (BID) | CUTANEOUS | 5 refills | Status: DC

## 2019-02-25 MED ORDER — LOSARTAN POTASSIUM 100 MG PO TABS: 100.0000 mg | ORAL_TABLET | Freq: Every day | ORAL | 3 refills | Status: DC

## 2019-02-25 NOTE — Patient Instructions (Signed)
Keep the diet clean and stay active.  Continue to monitor blood pressure.   Let us know if you need anything.

## 2019-02-25 NOTE — Progress Notes (Signed)
Chief Complaint  Patient presents with  . Follow-up    discuss BP    Subjective Jamie Sparks is a 63 y.o. male who presents for hypertension follow up. He does monitor home blood pressures. Blood pressures ranging from 150's/100's on average. He is compliant with medications-Norvasc 5 mg/d, HCTZ 25 mg/d. Patient has these side effects of medication: none He is adhering to a healthy diet overall. Current exercise: cycling, going to gym, walking  Patient has a history of eczema on his elbows.  He uses steroid cream for this it does seem to help.  There is a question of whether it was psoriasis or eczema in the past.  No bleeding, drainage, or pain.   Past Medical History:  Diagnosis Date  . Hypertension    Review of Systems Cardiovascular: no chest pain Respiratory:  no shortness of breath  Exam BP (!) 158/100 (BP Location: Left Arm, Patient Position: Sitting, Cuff Size: Normal)   Pulse 92   Temp 98 F (36.7 C) (Temporal)   Ht 6' (1.829 m)   Wt 220 lb 2 oz (99.8 kg)   SpO2 96%   BMI 29.85 kg/m  General:  well developed, well nourished, in no apparent distress Heart: RRR, no bruits, no LE edema Lungs: clear to auscultation, no accessory muscle use Skin: Scaly plaques/patches over the external right elbow with a pink base. Psych: well oriented with normal range of affect and appropriate judgment/insight  Essential hypertension - Plan: losartan (COZAAR) 100 MG tablet, Basic metabolic panel  Eczema, unspecified type - Plan: clobetasol ointment (TEMOVATE) 0.05 %  1-uncontrolled; add losartan, continue Norvasc and hydrochlorothiazide.  Check BMP in 1 week. Counseled on diet and exercise. 2-looks like psoriasis.  Will call in topical corticosteroid.  If it recurs, will refer to dermatology versus biopsy. F/u in 1 week for labs, 2 weeks to see me; if no improvement, will consider spironolactone versus beta-blocker; could change hydrochlorothiazide to  chlorthalidone. The patient voiced understanding and agreement to the plan.  [place], DO 02/25/19  12:01 PM

## 2019-03-10 ENCOUNTER — Other Ambulatory Visit: Payer: Self-pay

## 2019-03-11 ENCOUNTER — Ambulatory Visit (INDEPENDENT_AMBULATORY_CARE_PROVIDER_SITE_OTHER): Payer: BC Managed Care – PPO | Admitting: Family Medicine

## 2019-03-11 ENCOUNTER — Encounter: Payer: Self-pay | Admitting: Family Medicine

## 2019-03-11 VITALS — BP 138/82 | HR 83 | Temp 97.1°F | Ht 72.0 in | Wt 225.5 lb

## 2019-03-11 DIAGNOSIS — I10 Essential (primary) hypertension: Secondary | ICD-10-CM | POA: Diagnosis not present

## 2019-03-11 DIAGNOSIS — L309 Dermatitis, unspecified: Secondary | ICD-10-CM

## 2019-03-11 MED ORDER — AMLODIPINE BESYLATE 5 MG PO TABS: 5.0000 mg | ORAL_TABLET | Freq: Every day | ORAL | 2 refills | Status: DC

## 2019-03-11 MED ORDER — TRIAMCINOLONE ACETONIDE 0.5 % EX CREA: 1.0000 "application " | TOPICAL_CREAM | Freq: Two times a day (BID) | CUTANEOUS | 0 refills | Status: DC

## 2019-03-11 MED ORDER — HYDROCHLOROTHIAZIDE 25 MG PO TABS: 25.0000 mg | ORAL_TABLET | Freq: Every day | ORAL | 2 refills | Status: DC

## 2019-03-11 MED ORDER — LOSARTAN POTASSIUM 100 MG PO TABS: 100.0000 mg | ORAL_TABLET | Freq: Every day | ORAL | 2 refills | Status: DC

## 2019-03-11 NOTE — Patient Instructions (Signed)
Give us 2-3 business days to get the results of your labs back.   Keep the diet clean and stay active.  Because your blood pressure is well-controlled, you no longer have to check your blood pressure at home anymore unless you wish. Some people check it twice daily every day and some people stop altogether. Either or anything in between is fine. Strong work!  Let us know if you need anything. 

## 2019-03-11 NOTE — Progress Notes (Signed)
Chief Complaint  Patient presents with  . Follow-up    Subjective Jamie Sparks is a 63 y.o. male who presents for hypertension follow up. He does monitor home blood pressures. Blood pressures ranging from 120-130's/70-80's on average. He is compliant with medications-. Patient has these side effects of medication: none He is adhering to a healthy diet overall. Current exercise: lifting, cycling, walking  Clobetasol was not helpful for rash on elbow. Reports compliance. Still scaly.    Past Medical History:  Diagnosis Date  . Hypertension     Review of Systems Cardiovascular: no chest pain Respiratory:  no shortness of breath  Exam BP 138/82 (BP Location: Left Arm, Patient Position: Sitting, Cuff Size: Large)   Pulse 83   Temp (!) 97.1 F (36.2 C) (Temporal)   Ht 6' (1.829 m)   Wt 225 lb 8 oz (102.3 kg)   SpO2 94%   BMI 30.58 kg/m  General:  well developed, well nourished, in no apparent distress Heart: RRR, no bruits, no LE edema Lungs: clear to auscultation, no accessory muscle use Skin: Scaly plaques overly pink base on R elbow Psych: well oriented with normal range of affect and appropriate judgment/insight  Essential hypertension - Plan: amLODipine (NORVASC) 5 MG tablet, losartan (COZAAR) 100 MG tablet, hydrochlorothiazide (HYDRODIURIL) 25 MG tablet, Basic metabolic panel  Eczema, unspecified type - Plan: triamcinolone cream (KENALOG) 0.5 %  1- Controlled. OK to back off on ck'ing at home. Counseled on diet and exercise.  2- Stop clobetasol, start Kenalog. If no improvement, will refer to derm.  F/u in 6 mo for CPE or prn. The patient voiced understanding and agreement to the plan.  [place], DO 03/11/19  8:59 AM

## 2019-07-17 ENCOUNTER — Other Ambulatory Visit: Payer: Self-pay

## 2019-07-18 ENCOUNTER — Ambulatory Visit (INDEPENDENT_AMBULATORY_CARE_PROVIDER_SITE_OTHER): Payer: Medicare Other | Admitting: Family Medicine

## 2019-07-18 ENCOUNTER — Other Ambulatory Visit: Payer: Self-pay

## 2019-07-18 ENCOUNTER — Encounter: Payer: Self-pay | Admitting: Family Medicine

## 2019-07-18 VITALS — BP 142/84 | HR 82 | Temp 97.4°F | Ht 72.0 in | Wt 228.0 lb

## 2019-07-18 DIAGNOSIS — R2689 Other abnormalities of gait and mobility: Secondary | ICD-10-CM | POA: Diagnosis not present

## 2019-07-18 DIAGNOSIS — L409 Psoriasis, unspecified: Secondary | ICD-10-CM | POA: Diagnosis not present

## 2019-07-18 NOTE — Patient Instructions (Signed)
Give [place] 2-3 business days to get the results of your labs back.   Keep the diet clean and stay active.  Drink 55-60 oz of water daily. I think this is the issue.  Let us know if you need anything.

## 2019-07-18 NOTE — Progress Notes (Signed)
Chief Complaint  Patient presents with  . Follow-up    problems with balance    Subjective: Patient is a 64 y.o. male here for balance issues.  Over past 4 mo, has been having worsening balance issues.  His wife thinks it is worse than he does.  It is not positional.  Usually when he is walking or standing for periods of time, he feels unsteady.  He is not having any spinning/dizziness or lightheadedness.  This happened several years ago and he improved with physical therapy.  He has been doing some things from home stretching/exercise program.  He notices when he drinks more water, things get better.  He is not having any chest pain or shortness of breath.  Patient is also asking for oral medication for psoriasis.  Topical medication is cumbersome to put on and not as effective as it used to be.  He says things get better when the sun comes out.  ROS: Heart: Denies chest pain  Lungs: Denies SOB   Past Medical History:  Diagnosis Date  . Hypertension     Objective: BP (!) 142/84 (BP Location: Left Arm, Patient Position: Sitting, Cuff Size: Normal)   Pulse 82   Temp (!) 97.4 F (36.3 C) (Temporal)   Ht 6' (1.829 m)   Wt 228 lb (103.4 kg)   SpO2 98%   BMI 30.92 kg/m  General: Awake, appears stated age HEENT: MMM, EOMi Heart: RRR, no bruits Lungs: CTAB, no rales, wheezes or rhonchi. No accessory muscle use Neuro: DTRs equal and symmetric, strength 5/5 throughout; no clonus, no cerebellar signs, gait is normal Psych: Age appropriate judgment and insight, normal affect and mood  Assessment and Plan: Balance problem - Plan: B12, CBC, Comprehensive metabolic panel, CANCELED: CBC, CANCELED: Comprehensive metabolic panel, CANCELED: B12  Psoriasis - Plan: Ambulatory referral to Dermatology  1-likely secondary to hypovolemia, encourage 55-60 ounces of water daily.  Try to keep caffeine intake low.  Check above labs. 2-refer to dermatology for further management. Follow-up as  originally scheduled. The patient voiced understanding and agreement to the plan.  Jilda Roche [place], DO 07/18/19  3:45 PM

## 2019-07-19 LAB — CBC
HCT: 42.9 % (ref 38.5–50.0)
Hemoglobin: 14.3 g/dL (ref 13.2–17.1)
MCH: 29.3 pg (ref 27.0–33.0)
MCHC: 33.3 g/dL (ref 32.0–36.0)
MCV: 87.9 fL (ref 80.0–100.0)
MPV: 11.1 fL (ref 7.5–12.5)
Platelets: 280 10*3/uL (ref 140–400)
RBC: 4.88 10*6/uL (ref 4.20–5.80)
RDW: 13.1 % (ref 11.0–15.0)
WBC: 8 10*3/uL (ref 3.8–10.8)

## 2019-07-19 LAB — COMPREHENSIVE METABOLIC PANEL
AG Ratio: 1.7 (calc) (ref 1.0–2.5)
ALT: 11 U/L (ref 9–46)
AST: 16 U/L (ref 10–35)
Albumin: 4.3 g/dL (ref 3.6–5.1)
Alkaline phosphatase (APISO): 76 U/L (ref 35–144)
BUN/Creatinine Ratio: 15 (calc) (ref 6–22)
BUN: 19 mg/dL (ref 7–25)
CO2: 28 mmol/L (ref 20–32)
Calcium: 9.6 mg/dL (ref 8.6–10.3)
Chloride: 101 mmol/L (ref 98–110)
Creat: 1.31 mg/dL — ABNORMAL HIGH (ref 0.70–1.25)
Globulin: 2.5 g/dL (calc) (ref 1.9–3.7)
Glucose, Bld: 93 mg/dL (ref 65–99)
Potassium: 4.4 mmol/L (ref 3.5–5.3)
Sodium: 140 mmol/L (ref 135–146)
Total Bilirubin: 0.4 mg/dL (ref 0.2–1.2)
Total Protein: 6.8 g/dL (ref 6.1–8.1)

## 2019-07-19 LAB — VITAMIN B12: Vitamin B-12: 528 pg/mL (ref 200–1100)

## 2019-07-21 ENCOUNTER — Other Ambulatory Visit: Payer: Self-pay | Admitting: Family Medicine

## 2019-07-21 DIAGNOSIS — N289 Disorder of kidney and ureter, unspecified: Secondary | ICD-10-CM

## 2019-07-24 ENCOUNTER — Other Ambulatory Visit (INDEPENDENT_AMBULATORY_CARE_PROVIDER_SITE_OTHER): Payer: Medicare Other

## 2019-07-24 ENCOUNTER — Other Ambulatory Visit: Payer: Self-pay

## 2019-07-24 DIAGNOSIS — N289 Disorder of kidney and ureter, unspecified: Secondary | ICD-10-CM

## 2019-07-24 LAB — BASIC METABOLIC PANEL
BUN: 18 mg/dL (ref 6–23)
CO2: 31 mEq/L (ref 19–32)
Calcium: 9.4 mg/dL (ref 8.4–10.5)
Chloride: 101 mEq/L (ref 96–112)
Creatinine, Ser: 1.15 mg/dL (ref 0.40–1.50)
GFR: 63.81 mL/min (ref >60.00)
Glucose, Bld: 98 mg/dL (ref 70–99)
Potassium: 4.5 mEq/L (ref 3.5–5.1)
Sodium: 137 mEq/L (ref 135–145)

## 2019-09-08 ENCOUNTER — Telehealth: Payer: Self-pay

## 2019-09-08 ENCOUNTER — Other Ambulatory Visit: Payer: Self-pay

## 2019-09-08 ENCOUNTER — Encounter: Payer: Self-pay | Admitting: Family Medicine

## 2019-09-08 ENCOUNTER — Ambulatory Visit (INDEPENDENT_AMBULATORY_CARE_PROVIDER_SITE_OTHER): Payer: Medicare Other | Admitting: Family Medicine

## 2019-09-08 VITALS — BP 138/82 | HR 95 | Temp 97.0°F | Ht 72.0 in | Wt 230.0 lb

## 2019-09-08 DIAGNOSIS — Z114 Encounter for screening for human immunodeficiency virus [HIV]: Secondary | ICD-10-CM | POA: Diagnosis not present

## 2019-09-08 DIAGNOSIS — Z23 Encounter for immunization: Secondary | ICD-10-CM | POA: Diagnosis not present

## 2019-09-08 DIAGNOSIS — I10 Essential (primary) hypertension: Secondary | ICD-10-CM

## 2019-09-08 DIAGNOSIS — Z0000 Encounter for general adult medical examination without abnormal findings: Secondary | ICD-10-CM

## 2019-09-08 DIAGNOSIS — Z1159 Encounter for screening for other viral diseases: Secondary | ICD-10-CM

## 2019-09-08 DIAGNOSIS — Z1211 Encounter for screening for malignant neoplasm of colon: Secondary | ICD-10-CM

## 2019-09-08 DIAGNOSIS — E785 Hyperlipidemia, unspecified: Secondary | ICD-10-CM

## 2019-09-08 LAB — LIPID PANEL
Cholesterol: 185 mg/dL (ref 0–200)
HDL: 38.8 mg/dL — ABNORMAL LOW (ref >39.00)
LDL Cholesterol: 128 mg/dL — ABNORMAL HIGH (ref 0–99)
NonHDL: 146.4
Total CHOL/HDL Ratio: 5
Triglycerides: 94 mg/dL (ref 0.0–149.0)
VLDL: 18.8 mg/dL (ref 0.0–40.0)

## 2019-09-08 LAB — COMPREHENSIVE METABOLIC PANEL
ALT: 10 U/L (ref 0–53)
AST: 13 U/L (ref 0–37)
Albumin: 4.2 g/dL (ref 3.5–5.2)
Alkaline Phosphatase: 71 U/L (ref 39–117)
BUN: 18 mg/dL (ref 6–23)
CO2: 29 mEq/L (ref 19–32)
Calcium: 9.2 mg/dL (ref 8.4–10.5)
Chloride: 103 mEq/L (ref 96–112)
Creatinine, Ser: 1.21 mg/dL (ref 0.40–1.50)
GFR: 60.15 mL/min (ref >60.00)
Glucose, Bld: 109 mg/dL — ABNORMAL HIGH (ref 70–99)
Potassium: 4 mEq/L (ref 3.5–5.1)
Sodium: 139 mEq/L (ref 135–145)
Total Bilirubin: 0.4 mg/dL (ref 0.2–1.2)
Total Protein: 6.5 g/dL (ref 6.0–8.3)

## 2019-09-08 MED ORDER — METOPROLOL SUCCINATE ER 50 MG PO TB24: 50.0000 mg | ORAL_TABLET | Freq: Every day | ORAL | 3 refills | Status: DC

## 2019-09-08 NOTE — Telephone Encounter (Signed)
Cologuard ordered

## 2019-09-08 NOTE — Progress Notes (Signed)
Chief Complaint  Patient presents with  . Follow-up    blood pressure medication    Subjective: Pt here for initial Welcome to Medicare Evaluation.  Vision Screen:    Visual Acuity Screening   Right eye Left eye Both eyes  Without correction:     With correction: 20/50 20/30 20/20     Past Medical History:  Diagnosis Date  . Hypertension    Family History  Problem Relation Age of Onset  . Hypertension Mother   . Hypertension Sister   . Hypertension Sister    History reviewed. No pertinent surgical history. Current Outpatient Medications on File Prior to Visit  Medication Sig Dispense Refill  . amLODipine (NORVASC) 5 MG tablet Take 1 tablet (5 mg total) by mouth daily. 90 tablet 2  . co-enzyme Q-10 30 MG capsule Take 30 mg by mouth 3 (three) times daily.    losartan (COZAAR) 100 MG tablet Take 1 tablet (100 mg total) by mouth daily. 90 tablet 2  . Multiple Vitamin (MULTIVITAMIN) tablet Take 1 tablet by mouth daily.    Marland Kitchen triamcinolone cream (KENALOG) 0.5 % Apply 1 application topically 2 (two) times daily. 30 g 0   No Known Allergies  Females: Smoking, alcohol/drugs, sunscreen  Mental Health/Substance abuse evaluation: Yes; low risk PHQ-2:  Feelings of depression? No  Loss of satisfaction/pleasure in doing things? No   Fall Risk: Less than 2 falls within the past 12 months? Yes  Mindful of grabbing bars in bathroom, ruffles in rugs, poorly lit areas, handrails on the stairs? Yes   Discussion of functional ability done: Encouraged to maintain physical activity and flexibility. Live alone? No  Need help with the following? Bathing: No  Managing money: No  Taking medications: No  Telephone use: No  Transportation: No  Shopping: No  Preparing meals: No   Hearing/Vision screen: Trouble hearing TV or radio when others do not? No  Straining or struggling to hear/understand conversation? No   Fire safety: Have a working smoke alarm? Yes   Diet Balanced  diet? Yes  3 meals daily? Yes  Assistance needed? No   BP 138/82 (BP Location: Left Arm, Patient Position: Sitting, Cuff Size: Normal)   Pulse 95   Temp (!) 97 F (36.1 C) (Temporal)   Ht 6' (1.829 m)   Wt 230 lb (104.3 kg)   SpO2 94%   BMI 31.19 kg/m   End of life care planning/counseling: Does patient wish to discuss end of life care/planning? Yes  Does the patient have an advanced directive? Unsure Patient code status/living will: Full code Forms given? Yes   Assessment and Plan Medicare welcome visit  Screen for colon cancer  Encounter for hepatitis C screening test for low risk patient - Plan: Hepatitis C antibody  Screening for HIV (human immunodeficiency virus) - Plan: HIV Antibody (routine testing w rflx)  Essential hypertension - Plan: Comprehensive metabolic panel  Dyslipidemia - Plan: Lipid panel  Need for vaccination against Streptococcus pneumoniae - Plan: Pneumococcal polysaccharide vaccine 23-valent greater than or equal to 2yo subcutaneous/IM   Colonoscopy/Mammogram/Pelvic exam- Written plan in AVS for preventative health services. Immunizations Updated today. Care team: Me (PCP), going to see dermatologist in 3 days.   F/u in 6 weeks to reck BP; changing HCTZ to Toprol XL. The patient voiced understanding and agreement to the plan.  Marland Kitchen [place], DO 09/08/19 9:43 AM

## 2019-09-08 NOTE — Patient Instructions (Addendum)
Give [place] 2-3 business days to get the results of your labs back.   Keep the diet clean and stay active.  Don't get your covid shot for the next 2 weeks.   Someone should send something regarding your Cologard in the next 1-2 days.   Let us know if you need anything.

## 2019-09-09 ENCOUNTER — Other Ambulatory Visit: Payer: Self-pay | Admitting: Family Medicine

## 2019-09-09 DIAGNOSIS — E785 Hyperlipidemia, unspecified: Secondary | ICD-10-CM

## 2019-09-09 LAB — HIV ANTIBODY (ROUTINE TESTING W REFLEX): HIV 1&2 Ab, 4th Generation: NONREACTIVE

## 2019-09-09 LAB — HEPATITIS C ANTIBODY
Hepatitis C Ab: NONREACTIVE
SIGNAL TO CUT-OFF: 0.06 (ref <1.00)

## 2019-09-16 ENCOUNTER — Encounter: Payer: Self-pay | Admitting: Family Medicine

## 2019-09-16 ENCOUNTER — Other Ambulatory Visit: Payer: Self-pay

## 2019-09-16 ENCOUNTER — Ambulatory Visit (INDEPENDENT_AMBULATORY_CARE_PROVIDER_SITE_OTHER): Payer: Medicare Other | Admitting: Family Medicine

## 2019-09-16 VITALS — BP 132/82 | HR 72 | Temp 96.5°F | Wt 231.2 lb

## 2019-09-16 DIAGNOSIS — I10 Essential (primary) hypertension: Secondary | ICD-10-CM | POA: Diagnosis not present

## 2019-09-16 MED ORDER — HYDROCHLOROTHIAZIDE 25 MG PO TABS: 25.0000 mg | ORAL_TABLET | Freq: Every day | ORAL | 3 refills | Status: DC

## 2019-09-16 NOTE — Progress Notes (Signed)
Chief Complaint  Patient presents with  . Medication Problem    change BP medication    Subjective Jamie Sparks is a 64 y.o. male who presents for hypertension follow up. He does monitor home blood pressures. Blood pressures ranging from 130's/70's on average. He is compliant with medications- Norvasc 5 mg/d, losartan 100 mg/d, Toprol XL 50 mg/d. Patient has these side effects of medication: Does not like the way Norvasc makes him feel.  He is adhering to a healthy diet overall. Current exercise: cycling, wt resistance exercise   Past Medical History:  Diagnosis Date  . Hypertension     Exam BP 132/82 (BP Location: Left Arm, Patient Position: Sitting, Cuff Size: Normal)   Pulse 72   Temp (!) 96.5 F (35.8 C) (Temporal)   Wt 231 lb 4 oz (104.9 kg)   SpO2 98%   BMI 31.36 kg/m  General:  well developed, well nourished, in no apparent distress Heart: RRR, no bruits, no LE edema Lungs: clear to auscultation, no accessory muscle use Psych: well oriented with normal range of affect and appropriate judgment/insight  Essential hypertension - Plan: hydrochlorothiazide (HYDRODIURIL) 25 MG tablet, Basic metabolic panel  Status: Chronic; uncontrolled given intolerance to med Go back on HCTZ, stop Norvasc as he does not like the way it makes him feel. Counseled on diet and exercise. F/u in 1 week for BMP, as originally scheduled in June w me. The patient voiced understanding and agreement to the plan.  Jilda Roche [place], DO 09/16/19  12:00 PM

## 2019-09-16 NOTE — Patient Instructions (Addendum)
Keep checking your blood pressure at home.  Stop the amlodipine.  It's OK to take all your blood pressure medications at once.   Let us know if you need anything.

## 2019-09-18 LAB — COLOGUARD

## 2019-09-25 ENCOUNTER — Other Ambulatory Visit: Payer: Self-pay

## 2019-09-25 ENCOUNTER — Other Ambulatory Visit (INDEPENDENT_AMBULATORY_CARE_PROVIDER_SITE_OTHER): Payer: Medicare Other

## 2019-09-25 DIAGNOSIS — E785 Hyperlipidemia, unspecified: Secondary | ICD-10-CM | POA: Diagnosis not present

## 2019-09-25 DIAGNOSIS — I10 Essential (primary) hypertension: Secondary | ICD-10-CM | POA: Diagnosis not present

## 2019-09-25 LAB — LIPID PANEL
Cholesterol: 203 mg/dL — ABNORMAL HIGH (ref 0–200)
HDL: 34.4 mg/dL — ABNORMAL LOW (ref >39.00)
NonHDL: 168.37
Total CHOL/HDL Ratio: 6
Triglycerides: 214 mg/dL — ABNORMAL HIGH (ref 0.0–149.0)
VLDL: 42.8 mg/dL — ABNORMAL HIGH (ref 0.0–40.0)

## 2019-09-25 LAB — BASIC METABOLIC PANEL
BUN: 20 mg/dL (ref 6–23)
CO2: 31 mEq/L (ref 19–32)
Calcium: 9.3 mg/dL (ref 8.4–10.5)
Chloride: 101 mEq/L (ref 96–112)
Creatinine, Ser: 1.29 mg/dL (ref 0.40–1.50)
GFR: 55.86 mL/min — ABNORMAL LOW (ref >60.00)
Glucose, Bld: 121 mg/dL — ABNORMAL HIGH (ref 70–99)
Potassium: 4.1 mEq/L (ref 3.5–5.1)
Sodium: 138 mEq/L (ref 135–145)

## 2019-09-25 LAB — LDL CHOLESTEROL, DIRECT: Direct LDL: 126 mg/dL

## 2019-09-30 ENCOUNTER — Telehealth: Payer: Self-pay | Admitting: Family Medicine

## 2019-09-30 NOTE — Telephone Encounter (Signed)
Received cologuard results///they were Negative Updated Health Maintenance. Called the patient to inform/left detailed message of results. Paper work signed by PCP and sent to scan.

## 2019-10-03 ENCOUNTER — Other Ambulatory Visit: Payer: Self-pay

## 2019-10-03 ENCOUNTER — Ambulatory Visit (INDEPENDENT_AMBULATORY_CARE_PROVIDER_SITE_OTHER): Payer: Medicare Other | Admitting: Family Medicine

## 2019-10-03 ENCOUNTER — Encounter: Payer: Self-pay | Admitting: Family Medicine

## 2019-10-03 VITALS — BP 132/82 | HR 89 | Temp 97.0°F | Ht 72.0 in | Wt 233.4 lb

## 2019-10-03 DIAGNOSIS — R2689 Other abnormalities of gait and mobility: Secondary | ICD-10-CM | POA: Diagnosis not present

## 2019-10-03 DIAGNOSIS — E782 Mixed hyperlipidemia: Secondary | ICD-10-CM

## 2019-10-03 NOTE — Progress Notes (Signed)
Chief Complaint  Patient presents with  . Follow-up    lab results    Subjective: Patient is a 64 y.o. male here for lab f/u.  Patient was found to have mixed hyperlipidemia and decreased HDL on his lab draw 8 days ago.  Diet has been largely unchanged.  He is active with cycling and going to the gym.  Father had high cholesterol without heart disease.  He is not having any chest pain or shortness of breath.  The patient has a 2-year history of chronic balance issues and ataxia.  He saw neurology with [facility] but did not get along with her providers.  They state he has a history of CVA but he states it is not clear.  He is requesting an MRI because he is not getting better.  That being said, he is not having any worsening either.  Past Medical History:  Diagnosis Date  . Hypertension     Objective: BP 132/82 (BP Location: Left Arm, Patient Position: Sitting, Cuff Size: Normal)   Pulse 89   Temp (!) 97 F (36.1 C) (Temporal)   Ht 6' (1.829 m)   Wt 233 lb 6 oz (105.9 kg)   SpO2 98%   BMI 31.65 kg/m  General: Awake, appears stated age Heart: RRR Lungs: CTAB, no rales, wheezes or rhonchi. No accessory muscle use Neuro: Gait is cautious Psych: Age appropriate judgment and insight, normal affect and mood  Assessment and Plan: Balance problem - Plan: Ambulatory referral to Neurology  Mixed hyperlipidemia - Plan: Lipid panel  1-refer to our neurology team.  I will hold off on ordering any imaging at this time. 2-hold off on adding a statin, check lipids in 5 weeks.  If still elevated, will add medication. The patient voiced understanding and agreement to the plan.  Jilda Roche [place], DO 10/03/19  1:49 PM

## 2019-10-03 NOTE — Patient Instructions (Addendum)
If you do not hear anything about your referral in the next 1-2 weeks, call our office and ask for an update.  Keep the diet clean and stay active.  We will determine what the next steps are regarding your cholesterol based on the future results. Fast for 12 hours (nothing with calories) prior to lab draw.   Let us know if you need anything.

## 2019-10-10 ENCOUNTER — Encounter: Payer: Self-pay | Admitting: Neurology

## 2019-10-21 ENCOUNTER — Encounter: Payer: Self-pay | Admitting: Family Medicine

## 2019-10-21 ENCOUNTER — Other Ambulatory Visit: Payer: Self-pay

## 2019-10-21 ENCOUNTER — Ambulatory Visit (INDEPENDENT_AMBULATORY_CARE_PROVIDER_SITE_OTHER): Payer: Medicare Other | Admitting: Family Medicine

## 2019-10-21 VITALS — BP 136/86 | HR 80 | Temp 95.6°F | Ht 72.0 in | Wt 235.0 lb

## 2019-10-21 DIAGNOSIS — I10 Essential (primary) hypertension: Secondary | ICD-10-CM

## 2019-10-21 MED ORDER — CARVEDILOL 25 MG PO TABS: 25.0000 mg | ORAL_TABLET | Freq: Two times a day (BID) | ORAL | 3 refills | Status: DC

## 2019-10-21 NOTE — Patient Instructions (Signed)
Keep checking your blood pressure at home.  Keep the diet clean and stay active.  Let us know if you need anything.

## 2019-10-21 NOTE — Progress Notes (Signed)
Chief Complaint  Patient presents with   Follow-up    Subjective Jamie Sparks is a 64 y.o. male who presents for hypertension follow up. He does monitor home blood pressures. Blood pressures ranging from 130's/80's on average. He is compliant with medications- HCTZ 25 mg/d, losartan 100 mg/d, Toprol XL 50 mg/d. Patient has these side effects of medication: freq urination He is adhering to a healthy diet overall. Current exercise: cycling   Past Medical History:  Diagnosis Date   Hypertension    Exam BP 136/86 (BP Location: Left Arm, Patient Position: Sitting, Cuff Size: Normal)    Pulse 80    Temp (!) 95.6 F (35.3 C) (Temporal)    Ht 6' (1.829 m)    Wt 235 lb (106.6 kg)    SpO2 100%    BMI 31.87 kg/m  General:  well developed, well nourished, in no apparent distress Heart: RRR, no bruits, no LE edema Lungs: clear to auscultation, no accessory muscle use Psych: well oriented with normal range of affect and appropriate judgment/insight  Essential hypertension - Plan: carvedilol (COREG) 25 MG tablet  Stop HCTZ. Change Toprol to Coreg 25 mg bid. Counseled on diet and exercise. Cont losartan.  F/u in 1 mo. The patient voiced understanding and agreement to the plan.  Jamie Roche [place], DO 10/21/19  9:05 AM

## 2019-10-27 ENCOUNTER — Telehealth: Payer: Self-pay

## 2019-10-27 NOTE — Telephone Encounter (Signed)
Patient stated that his blood pressure at first when starting Coreg that it was fine.  Yesterday he worked out and was checking is constantly and it was 170 something over 100 something.  Today he checked and it was 140/82.  Advised patient was he relaxed when checking.  Yesterday he was not and today he stated was the first time he could sit down and relax to take it.  Would you you like for him to continue or would you like for him to anything different.  His follow up is 11/18/19.

## 2019-10-27 NOTE — Telephone Encounter (Signed)
Continue as is. Don't check continuously. Make sure to relax for 5-10 minutes prior tochecking. In another week if high, we will make change. Ty.

## 2019-10-27 NOTE — Telephone Encounter (Signed)
Patient called in to speak with Dr. Carmelia Roller or the nurse about his blood pressure medication carvedilol (COREG) 25 MG tablet [[id number]]    Please give the patient a call back at [phone]

## 2019-10-29 NOTE — Telephone Encounter (Signed)
Called informed of PCP instructions. The patient verbalized understanding 

## 2019-11-13 ENCOUNTER — Telehealth: Payer: Self-pay | Admitting: Family Medicine

## 2019-11-13 NOTE — Telephone Encounter (Signed)
Patient states blood pressure is 185/101, patient states started new regiment of blood pressure medication about 2weeks ago . Patient that he feels fine. Patient would like to know is he should add some of his old medication to regiment to bring blood pressure down.

## 2019-11-13 NOTE — Telephone Encounter (Signed)
Spoke to pt. He reports BP reading yesterday 164/92 @ 7pm. States readings were averaging 140/75-77 prior to yesterday. Pt denies chest pain, sob, headaches, no visual or speech disturbance and no unilateral weakness. Pt was advised to call 911 if he develops any of those symptoms and he voices agreement. Pt does have appointment with Dr Jonny Ruiz tomorrow at [facility] for his elevated BP.  Please advise if you have any other recommendations for pt.

## 2019-11-13 NOTE — Telephone Encounter (Signed)
If he is seeing Dr. Jonny Ruiz tomorrow, nothing to add for now.   Dr. Jonny Ruiz- A little background- we had been working on controlling his BP and had him optimized but stopped his HCTZ due to freq urination. He has been on Norvasc and did not like the way it made him feel. We had changed his Toprol to Coreg 25 to help offset the blood pressure control we would be losing by stopping HCTZ. Text/call if anything doesn't make sense.

## 2019-11-14 ENCOUNTER — Ambulatory Visit: Payer: Medicare Other | Admitting: Internal Medicine

## 2019-11-21 NOTE — Progress Notes (Deleted)
NEUROLOGY CONSULTATION NOTE  Jamie Sparks MRN: [id number] DOB: 1955/09/05  Referring provider: [name], DO Primary care provider: [name], DO  Reason for consult:  Balance disorder  HISTORY OF PRESENT ILLNESS: Jamie Sparks is a 64 year old ***-handed male with HTN and stroke who presents for balance disorder.  History supplemented by referring provider's and prior neurologist's notes.  In late 2019, he began having problems with his balance.  ***.  At the time, he was found to have uncontrolled hypertension running as high as 204/108.  Due to worsening balance, he went to the ED on ***/***/2019 where CT head showed no acute intracranial abnormality.  He later had an MRI of the brain with and without contrast performed on 02/22/2018, which showed extensive chronic small vessel disease with multiple chronic lacunar infarcts including the cerebellum, brainstem and basal ganglia, and chronic microhemorrhages, presumably from his uncontrolled hypertension.  He followed up with neurology.  Echocardiogram EF 55-60% with no cardiac source of emboli.  EKG was unremarkable.  CTA of head and neck was normal.  Cardiac event monitor was negative for a fib.  He continued to have uncontrolled blood pressure over the next year.    01/26/2018 CT HEAD WO:  No intracranial hemorrhage, extra-axial fluid collection, intracranial mass, mass effect, or midline shift. No cerebral edema or acute cortical infarction. There is moderate central and cortical atrophy. There is no hydrocephalus or asymmetry. There are chronic ischemic changes in the periventricular white matter.   02/25/2018 MRI BRAIN WO W:  There are multiple old lacunar infarcts involving the right side of the cerebellum, left side of the pons, bilateral thalami and the basal ganglia and bilateral periventricular cerebral white matter. There are innumerable punctate foci of hemosiderin deposition throughout the  brainstem, basal ganglia, cerebellum and bilateral cerebral hemispheres, mostly in deep locations.  There is no abnormal contrast enhancement.  There is no acute infarct.  There are normal flow signal voids in the carotid arteries and basilar artery.  No acute hemorrhage.  There is no mass lesion.  There is no hydrocephalus.  03/23/2018 CTA HEAD NECK:  Essentially normal CT angiography of the head and neck. No stenosis, aneurysm, or large vessel occlusion.   07/18/2019 LABS:  B12 528 09/25/2019 LABS:  Lipid panel with t chol 203, TG 214, HDL 34.40, direct LDL 126   PAST MEDICAL HISTORY: Past Medical History:  Diagnosis Date  . Hypertension     PAST SURGICAL HISTORY: No past surgical history on file.  MEDICATIONS: Current Outpatient Medications on File Prior to Visit  Medication Sig Dispense Refill  . carvedilol (COREG) 25 MG tablet Take 1 tablet (25 mg total) by mouth 2 (two) times daily with a meal. 60 tablet 3  . co-enzyme Q-10 30 MG capsule Take 30 mg by mouth 3 (three) times daily.    Marland Kitchen losartan (COZAAR) 100 MG tablet Take 1 tablet (100 mg total) by mouth daily. 90 tablet 2  . Multiple Vitamin (MULTIVITAMIN) tablet Take 1 tablet by mouth daily.    Marland Kitchen triamcinolone cream (KENALOG) 0.5 % Apply 1 application topically 2 (two) times daily. 30 g 0   No current facility-administered medications on file prior to visit.    ALLERGIES: No Known Allergies  FAMILY HISTORY: Family History  Problem Relation Age of Onset  . Hypertension Mother   . Hypertension Sister   . Hypertension Sister    ***.  SOCIAL HISTORY: Social History   Socioeconomic History  . Marital  status: Married    Spouse name: Not on file  . Number of children: Not on file  . Years of education: Not on file  . Highest education level: Not on file  Occupational History  . Not on file  Tobacco Use  . Smoking status: Never Smoker  . Smokeless tobacco: Never Used  Substance and Sexual Activity  . Alcohol use:  Yes  . Drug use: No  . Sexual activity: Not on file  Other Topics Concern  . Not on file  Social History Narrative  . Not on file   Social Determinants of Health   Financial Resource Strain:   . Difficulty of Paying Living Expenses:   Food Insecurity:   . Worried About Programme researcher, broadcasting/film/video in the Last Year:   . Barista in the Last Year:   Transportation Needs:   . Freight forwarder (Medical):   Marland Kitchen Lack of Transportation (Non-Medical):   Physical Activity:   . Days of Exercise per Week:   . Minutes of Exercise per Session:   Stress:   . Feeling of Stress :   Social Connections:   . Frequency of Communication with Friends and Family:   . Frequency of Social Gatherings with Friends and Family:   . Attends Religious Services:   . Active Member of Clubs or Organizations:   . Attends Banker Meetings:   Marland Kitchen Marital Status:   Intimate Partner Violence:   . Fear of Current or Ex-Partner:   . Emotionally Abused:   Marland Kitchen Physically Abused:   . Sexually Abused:     REVIEW OF SYSTEMS: Constitutional: No fevers, chills, or sweats, no generalized fatigue, change in appetite Eyes: No visual changes, double vision, eye pain Ear, nose and throat: No hearing loss, ear pain, nasal congestion, sore throat Cardiovascular: No chest pain, palpitations Respiratory:  No shortness of breath at rest or with exertion, wheezes GastrointestinaI: No nausea, vomiting, diarrhea, abdominal pain, fecal incontinence Genitourinary:  No dysuria, urinary retention or frequency Musculoskeletal:  No neck pain, back pain Integumentary: No rash, pruritus, skin lesions Neurological: as above Psychiatric: No depression, insomnia, anxiety Endocrine: No palpitations, fatigue, diaphoresis, mood swings, change in appetite, change in weight, increased thirst Hematologic/Lymphatic:  No purpura, petechiae. Allergic/Immunologic: no itchy/runny eyes, nasal congestion, recent allergic reactions,  rashes  PHYSICAL EXAM: *** General: No acute distress.  Patient appears ***-groomed.  *** Head:  Normocephalic/atraumatic Eyes:  fundi examined but not visualized Neck: supple, no paraspinal tenderness, full range of motion Back: No paraspinal tenderness Heart: regular rate and rhythm Lungs: Clear to auscultation bilaterally. Vascular: No carotid bruits. Neurological Exam: Mental status: alert and oriented to person, place, and time, recent and remote memory intact, fund of knowledge intact, attention and concentration intact, speech fluent and not dysarthric, language intact. Cranial nerves: CN I: not tested CN II: pupils equal, round and reactive to light, visual fields intact CN III, IV, VI:  full range of motion, no nystagmus, no ptosis CN V: facial sensation intact CN VII: upper and lower face symmetric CN VIII: hearing intact CN IX, X: gag intact, uvula midline CN XI: sternocleidomastoid and trapezius muscles intact CN XII: tongue midline Bulk & Tone: normal, no fasciculations. Motor:  5/5 throughout *** Sensation:  Pinprick *** temperature *** and vibration sensation intact.  ***. Deep Tendon Reflexes:  2+ throughout, *** toes downgoing.  *** Finger to nose testing:  Without dysmetria.  *** Heel to shin:  Without dysmetria.  *** Gait:  Normal station and stride.  Able to turn and tandem walk. [place] ***.  IMPRESSION: ***  PLAN: ***  Thank you for allowing me to take part in the care of this patient.  [name], DO  CC: ***

## 2019-11-24 ENCOUNTER — Ambulatory Visit (INDEPENDENT_AMBULATORY_CARE_PROVIDER_SITE_OTHER): Payer: Medicare Other | Admitting: Family Medicine

## 2019-11-24 ENCOUNTER — Other Ambulatory Visit: Payer: Self-pay

## 2019-11-24 ENCOUNTER — Encounter: Payer: Self-pay | Admitting: Family Medicine

## 2019-11-24 ENCOUNTER — Ambulatory Visit: Payer: Medicare Other | Admitting: Neurology

## 2019-11-24 VITALS — BP 140/92 | HR 64 | Temp 98.1°F | Ht 72.0 in | Wt 241.1 lb

## 2019-11-24 DIAGNOSIS — I10 Essential (primary) hypertension: Secondary | ICD-10-CM

## 2019-11-24 MED ORDER — HYDRALAZINE HCL 25 MG PO TABS: 25.0000 mg | ORAL_TABLET | Freq: Three times a day (TID) | ORAL | 2 refills | Status: DC

## 2019-11-24 NOTE — Patient Instructions (Signed)
Continue checking your blood pressure at home.  Keep the diet clean and stay active.  Let us know if you need anything.

## 2019-11-24 NOTE — Progress Notes (Signed)
Chief Complaint  Patient presents with  . Follow-up    blood pressure    Subjective Jamie Sparks is a 64 y.o. male who presents for hypertension follow up. He does monitor home blood pressures. Blood pressures ranging from 140's/90's on average. He is compliant with medications- losartan 100 mg/d, Coreg 25 mg bid. Patient has these side effects of medication: none Did not like the way Norvasc made him feel. He had freq urination on HCTZ and requested to change. Did well on HCTZ, Losartan and Toprol XL. He is usually adhering to a healthy diet overall. Current exercise: cycling   Past Medical History:  Diagnosis Date  . Hypertension     Exam BP (!) 140/92 (BP Location: Left Arm, Patient Position: Sitting, Cuff Size: Normal)   Pulse 64   Temp 98.1 F (36.7 C) (Oral)   Ht 6' (1.829 m)   Wt 241 lb 2 oz (109.4 kg)   SpO2 98%   BMI 32.70 kg/m  General:  well developed, well nourished, in no apparent distress Heart: RRR, no bruits, no LE edema Lungs: clear to auscultation, no accessory muscle use Psych: well oriented with normal range of affect and appropriate judgment/insight  Essential hypertension - Plan: hydrALAZINE (APRESOLINE) 25 MG tablet  Status: Uncontrolled. Counseled on diet and exercise. Cont Coreg and Losartan. If still elevated, will return to regimen that worked the best and he will deal with AE's.  F/u in 3 weeks. The patient voiced understanding and agreement to the plan.  Jilda Roche [place], DO 11/24/19  11:49 AM

## 2019-11-25 ENCOUNTER — Telehealth: Payer: Self-pay | Admitting: Family Medicine

## 2019-11-25 ENCOUNTER — Other Ambulatory Visit: Payer: Self-pay | Admitting: Family Medicine

## 2019-11-25 DIAGNOSIS — I10 Essential (primary) hypertension: Secondary | ICD-10-CM

## 2019-11-25 MED ORDER — CHLORTHALIDONE 25 MG PO TABS
25.0000 mg | ORAL_TABLET | Freq: Every day | ORAL | 2 refills | Status: DC
Start: 2019-11-25 — End: 2019-12-15

## 2019-11-25 NOTE — Telephone Encounter (Signed)
New water pill sent. We need to check labs in a week, plz sched and order BMP. If this doesn't work, we will go on original regimen. Ty.

## 2019-11-25 NOTE — Telephone Encounter (Signed)
Put in order for labs  Called the patient to inform Scheduled lab appointment

## 2019-11-25 NOTE — Progress Notes (Signed)
bmp 

## 2019-11-25 NOTE — Telephone Encounter (Signed)
hydrALAZINE (APRESOLINE) 25 MG tablet    Patient states that medication prescribed yesterday has make him sick. Patient states he would like another medication prescribed.

## 2019-12-01 ENCOUNTER — Other Ambulatory Visit: Payer: Self-pay | Admitting: Family Medicine

## 2019-12-04 ENCOUNTER — Other Ambulatory Visit: Payer: Self-pay

## 2019-12-04 ENCOUNTER — Other Ambulatory Visit (INDEPENDENT_AMBULATORY_CARE_PROVIDER_SITE_OTHER): Payer: Medicare Other

## 2019-12-04 DIAGNOSIS — E782 Mixed hyperlipidemia: Secondary | ICD-10-CM

## 2019-12-04 DIAGNOSIS — I10 Essential (primary) hypertension: Secondary | ICD-10-CM | POA: Diagnosis not present

## 2019-12-04 LAB — LIPID PANEL
Cholesterol: 165 mg/dL (ref 0–200)
HDL: 29.7 mg/dL — ABNORMAL LOW (ref >39.00)
NonHDL: 134.99
Total CHOL/HDL Ratio: 6
Triglycerides: 247 mg/dL — ABNORMAL HIGH (ref 0.0–149.0)
VLDL: 49.4 mg/dL — ABNORMAL HIGH (ref 0.0–40.0)

## 2019-12-04 LAB — BASIC METABOLIC PANEL
BUN: 18 mg/dL (ref 6–23)
CO2: 29 mEq/L (ref 19–32)
Calcium: 9.2 mg/dL (ref 8.4–10.5)
Chloride: 102 mEq/L (ref 96–112)
Creatinine, Ser: 1.16 mg/dL (ref 0.40–1.50)
GFR: 63.1 mL/min (ref >60.00)
Glucose, Bld: 115 mg/dL — ABNORMAL HIGH (ref 70–99)
Potassium: 4.1 mEq/L (ref 3.5–5.1)
Sodium: 136 mEq/L (ref 135–145)

## 2019-12-04 LAB — LDL CHOLESTEROL, DIRECT: Direct LDL: 104 mg/dL

## 2019-12-08 ENCOUNTER — Telehealth: Payer: Self-pay | Admitting: *Deleted

## 2019-12-08 NOTE — Telephone Encounter (Signed)
Jamie Sparks -- this pt is scheduled for a lab appt on 12/10/19.  I do not see any future orders and I do not see anything saying he is due for labs now. 7/29 result note says he should repeat lipid panel in 6 weeks after starting medication. Pt also has OV with Dr Carmelia Roller on 12/15/19.  Please verify if pt is due for labs now and if so please place appropriate future orders.  Thanks!

## 2019-12-08 NOTE — Telephone Encounter (Signed)
Plz cancel lab appointment and we can address any blood work needs on 8/9. Ty.

## 2019-12-08 NOTE — Telephone Encounter (Signed)
Lab appt canceled. Called the patient and informed lab appt canceled..will discuss at next OV on 12/15/19

## 2019-12-10 ENCOUNTER — Other Ambulatory Visit: Payer: Medicare Other

## 2019-12-15 ENCOUNTER — Ambulatory Visit (INDEPENDENT_AMBULATORY_CARE_PROVIDER_SITE_OTHER): Payer: Medicare Other | Admitting: Family Medicine

## 2019-12-15 ENCOUNTER — Encounter: Payer: Self-pay | Admitting: Family Medicine

## 2019-12-15 ENCOUNTER — Other Ambulatory Visit: Payer: Self-pay

## 2019-12-15 VITALS — BP 130/80 | HR 63 | Temp 98.0°F | Ht 72.0 in | Wt 232.2 lb

## 2019-12-15 DIAGNOSIS — I10 Essential (primary) hypertension: Secondary | ICD-10-CM

## 2019-12-15 DIAGNOSIS — E782 Mixed hyperlipidemia: Secondary | ICD-10-CM

## 2019-12-15 MED ORDER — HYDROCHLOROTHIAZIDE 25 MG PO TABS: 25.0000 mg | ORAL_TABLET | Freq: Every day | ORAL | 2 refills | Status: DC

## 2019-12-15 MED ORDER — METOPROLOL SUCCINATE ER 50 MG PO TB24: 50.0000 mg | ORAL_TABLET | Freq: Every day | ORAL | 2 refills | Status: DC

## 2019-12-15 MED ORDER — ROSUVASTATIN CALCIUM 10 MG PO TABS: 10.0000 mg | ORAL_TABLET | Freq: Every day | ORAL | 3 refills | Status: DC

## 2019-12-15 NOTE — Progress Notes (Signed)
Chief Complaint  Patient presents with  . Follow-up    medication    Subjective Jamie Sparks is a 64 y.o. male who presents for hypertension follow up. He does monitor home blood pressures. Blood pressures ranging from 130's/80's on average. He is compliant with medications- chlorthalidone 25 mg/d, losartan 100 mg/d, Coreg 25 mg bid. Patient has these side effects of medication: depth perception issue when he is driving.  He is adhering to a healthy diet overall. Current exercise: cycling, walking  Cholesterol found to be elevated. Not on statin. Interested in starting as his lifestyle is pretty healthy and there is not much to modify at this time.    Past Medical History:  Diagnosis Date  . Hypertension     Exam BP 130/80 (BP Location: Left Arm, Patient Position: Sitting, Cuff Size: Normal)   Pulse 63   Temp 98 F (36.7 C) (Oral)   Ht 6' (1.829 m)   Wt 232 lb 4 oz (105.3 kg)   SpO2 97%   BMI 31.50 kg/m  General:  well developed, well nourished, in no apparent distress Heart: RRR, no bruits, no LE edema Lungs: clear to auscultation, no accessory muscle use Psych: well oriented with normal range of affect and appropriate judgment/insight  Essential hypertension - Plan: hydrochlorothiazide (HYDRODIURIL) 25 MG tablet, metoprolol succinate (TOPROL-XL) 50 MG 24 hr tablet  Mixed hyperlipidemia - Plan: rosuvastatin (CRESTOR) 10 MG tablet, Hepatic function panel, Lipid panel  1. Go back to known effective regimen of Toprol, losartan and HCTZ. He had some increased urination and we changed. AE's and poor efficacy caused issues since then. Counseled on diet and exercise. 2. Start statin, reck labs in 6 weeks.  F/u in 6 mo for med ck. The patient voiced understanding and agreement to the plan.  Jilda Roche [place], DO 12/15/19  10:25 AM

## 2019-12-15 NOTE — Patient Instructions (Addendum)
Keep the diet clean and stay active.  Give [place] 2-3 business days to get the results of your labs back.   Take your final dose of Coreg/cardevilol this evening. Tomorrow you are back on the metoprolol.   I would recommend Pfizer or the Moderna vaccine.  Let us know if you need anything.

## 2020-01-01 ENCOUNTER — Ambulatory Visit: Payer: Medicare Other | Admitting: Neurology

## 2020-01-13 ENCOUNTER — Other Ambulatory Visit: Payer: Self-pay | Admitting: Family Medicine

## 2020-01-13 DIAGNOSIS — I10 Essential (primary) hypertension: Secondary | ICD-10-CM

## 2020-01-14 NOTE — Progress Notes (Signed)
NEUROLOGY CONSULTATION NOTE  Jamie Sparks MRN: [id number] DOB: 1956-02-03  Referring provider: [name], DO Primary care provider: [name], DO  Reason for consult:  Balance problems  HISTORY OF PRESENT ILLNESS: Jamie Sparks is a 64 year old right-handed male with hypertension, hyperlipidemia and history of stroke who presents for balance problems.  History supplemented by prior neurologist's and referring provider's notes.  He started having balance problems in 2019.  When he kneels down, he can't stand up unless he holds onto something.  Also, he would have trouble maintaining his speed when walking downhill.  He had trouble riding road bikes.  No dizziness.  No unsteadiness otherwise when walking. Able to maintain balance when standing.  He never feels like he is going to fall.   Denies neck, back or leg pain.  No numbness or weakness in the legs.  He saw a neurologist at that time.  MRI of brain performed on 02/22/2018 reportedly showed extensive chronic small vessel disease with microhemorrhages and multiple chronic infarcts (including cerebellum and basal ganglia) as well as a ring-enhancing lesion in the right periventricular region.  Echocardiogram showed EF 55-60% with no cardiac source of embolus and EKG was unremarkable.  He was seen by neurology.  CTA of head and neck on 03/23/2018 was unremarkable with no evidence of occlusion or significant stenosis.  He was advised to start ASA and Lipitor.  He went to physical therapy which was reportedly helpful.  He goes to the gym daily, using the treadmill 45 minutes, stationary bike 45 minutes and works out with [employer].  Overall, balance has improved.    Labs: 01/28/2018:  TSH 1.84 12/04/2019:  Direct LDL 104 09/08/2019:  HIV and Hep C negative 07/18/2019:  B12 528  PAST MEDICAL HISTORY: Past Medical History:  Diagnosis Date  . Hypertension     PAST SURGICAL HISTORY: No past surgical  history on file.  MEDICATIONS: Current Outpatient Medications on File Prior to Visit  Medication Sig Dispense Refill  . carvedilol (COREG) 25 MG tablet TAKE 1 TABLET (25 MG TOTAL) BY MOUTH 2 (TWO) TIMES DAILY WITH A MEAL. 180 tablet 1  . co-enzyme Q-10 30 MG capsule Take 30 mg by mouth 3 (three) times daily.    . hydrochlorothiazide (HYDRODIURIL) 25 MG tablet Take 1 tablet (25 mg total) by mouth daily. 90 tablet 2  . losartan (COZAAR) 100 MG tablet Take 1 tablet (100 mg total) by mouth daily. 90 tablet 2  . metoprolol succinate (TOPROL-XL) 50 MG 24 hr tablet Take 1 tablet (50 mg total) by mouth daily. Take with or immediately following a meal. 90 tablet 2  . Multiple Vitamin (MULTIVITAMIN) tablet Take 1 tablet by mouth daily.    . rosuvastatin (CRESTOR) 10 MG tablet Take 1 tablet (10 mg total) by mouth daily. 90 tablet 3   No current facility-administered medications on file prior to visit.    ALLERGIES: No Known Allergies  FAMILY HISTORY: Family History  Problem Relation Age of Onset  . Hypertension Mother   . Hypertension Sister   . Hypertension Sister    SOCIAL HISTORY: Social History   Socioeconomic History  . Marital status: Married    Spouse name: Not on file  . Number of children: Not on file  . Years of education: Not on file  . Highest education level: Not on file  Occupational History  . Not on file  Tobacco Use  . Smoking status: Never Smoker  . Smokeless tobacco: Never  Used  Substance and Sexual Activity  . Alcohol use: Yes  . Drug use: No  . Sexual activity: Not on file  Other Topics Concern  . Not on file  Social History Narrative  . Not on file   Social Determinants of Health   Financial Resource Strain:   . Difficulty of Paying Living Expenses: Not on file  Food Insecurity:   . Worried About Programme researcher, broadcasting/film/video in the Last Year: Not on file  . Ran Out of Food in the Last Year: Not on file  Transportation Needs:   . Lack of Transportation  (Medical): Not on file  . Lack of Transportation (Non-Medical): Not on file  Physical Activity:   . Days of Exercise per Week: Not on file  . Minutes of Exercise per Session: Not on file  Stress:   . Feeling of Stress : Not on file  Social Connections:   . Frequency of Communication with Friends and Family: Not on file  . Frequency of Social Gatherings with Friends and Family: Not on file  . Attends Religious Services: Not on file  . Active Member of Clubs or Organizations: Not on file  . Attends Banker Meetings: Not on file  . Marital Status: Not on file  Intimate Partner Violence:   . Fear of Current or Ex-Partner: Not on file  . Emotionally Abused: Not on file  . Physically Abused: Not on file  . Sexually Abused: Not on file    PHYSICAL EXAM: Blood pressure (!) 183/111, pulse 87, resp. rate 18, height 6' (1.829 m), weight 236 lb (107 kg), SpO2 98 %. General: No acute distress.  Patient appears well-groomed.  Head:  Normocephalic/atraumatic Eyes:  fundi examined but not visualized Neck: supple, no paraspinal tenderness, full range of motion Back: No paraspinal tenderness Heart: regular rate and rhythm Lungs: Clear to auscultation bilaterally. Vascular: No carotid bruits. Neurological Exam: Mental status: alert and oriented to person, place, and time, recent and remote memory intact, fund of knowledge intact, attention and concentration intact, speech fluent and not dysarthric, language intact. Cranial nerves: CN I: not tested CN II: pupils equal, round and reactive to light, visual fields intact CN III, IV, VI:  full range of motion, no nystagmus, no ptosis CN V: facial sensation intact CN VII: upper and lower face symmetric CN VIII: hearing intact CN IX, X: gag intact, uvula midline CN XI: sternocleidomastoid and trapezius muscles intact CN XII: tongue midline Bulk & Tone: normal, no fasciculations. Motor:  5/5 throughout  Sensation:  Pinprick and  vibration sensation intact.   Deep Tendon Reflexes:  2+ throughout, toes downgoing.  Finger to nose testing:  Without dysmetria.   Heel to shin:  Without dysmetria.   Gait:  Normal station and stride.  Able to turn, mild difficulty with tandem walk. Romberg negative.  IMPRESSION: 1.  Balance problems.  I do not appreciate any significant abnormalities on neurologic exam.  He does not exhibit symptoms suggestive of myelopathy, lumbar spinal stenosis, peripheral neuropathy, lower extremity weakness or signs suggestive of underlying neurodegenerative disorder.  Prior MRI of brain reportedly showed extensive chronic small vessel ischemic changes, including the cerebellum, which may be contributing.   2.  Abnormal brain MRI finding, possibly focus of ischemia but follow up exam warranted. 3.  Uncontrolled hypertension  PLAN: Repeat MRI of brain with and without contrast Further recommendations pending results. Follow up with PCP regarding blood pressure.  Thank you for allowing me to take part  in the care of this patient.  [name], DO  CC: [name], DO

## 2020-01-15 ENCOUNTER — Encounter: Payer: Self-pay | Admitting: Neurology

## 2020-01-15 ENCOUNTER — Other Ambulatory Visit: Payer: Self-pay

## 2020-01-15 ENCOUNTER — Ambulatory Visit (INDEPENDENT_AMBULATORY_CARE_PROVIDER_SITE_OTHER): Payer: Medicare Other | Admitting: Neurology

## 2020-01-15 VITALS — BP 183/111 | HR 87 | Resp 18 | Ht 72.0 in | Wt 236.0 lb

## 2020-01-15 DIAGNOSIS — R2689 Other abnormalities of gait and mobility: Secondary | ICD-10-CM | POA: Diagnosis not present

## 2020-01-15 DIAGNOSIS — I679 Cerebrovascular disease, unspecified: Secondary | ICD-10-CM | POA: Diagnosis not present

## 2020-01-15 DIAGNOSIS — R9089 Other abnormal findings on diagnostic imaging of central nervous system: Secondary | ICD-10-CM | POA: Diagnosis not present

## 2020-01-15 DIAGNOSIS — I10 Essential (primary) hypertension: Secondary | ICD-10-CM | POA: Diagnosis not present

## 2020-01-15 NOTE — Patient Instructions (Addendum)
1.  We will repeat MRI of brain with and without contrast 2.  Contact your PCP's office regarding your blood pressure 3.  Follow up after testing.

## 2020-01-24 ENCOUNTER — Other Ambulatory Visit: Payer: Self-pay

## 2020-01-24 ENCOUNTER — Ambulatory Visit (HOSPITAL_BASED_OUTPATIENT_CLINIC_OR_DEPARTMENT_OTHER)
Admission: RE | Admit: 2020-01-24 | Discharge: 2020-01-24 | Disposition: A | Discharge status: Home / Self Care | Payer: Medicare Other | Source: Ambulatory Visit | Attending: Neurology | Admitting: Neurology

## 2020-01-24 DIAGNOSIS — R2689 Other abnormalities of gait and mobility: Secondary | ICD-10-CM | POA: Diagnosis present

## 2020-01-24 DIAGNOSIS — I679 Cerebrovascular disease, unspecified: Secondary | ICD-10-CM | POA: Diagnosis present

## 2020-01-24 DIAGNOSIS — R9089 Other abnormal findings on diagnostic imaging of central nervous system: Secondary | ICD-10-CM | POA: Insufficient documentation

## 2020-01-24 MED ORDER — GADOBUTROL 1 MMOL/ML IV SOLN
10.0000 mL | Freq: Once | INTRAVENOUS | Status: AC | PRN
  Administered 2020-01-24: 10 mL via INTRAVENOUS

## 2020-01-27 ENCOUNTER — Telehealth: Payer: Self-pay

## 2020-01-27 NOTE — Telephone Encounter (Signed)
-----   Message from Drema Dallas, DO sent at 01/26/2020  7:23 AM EDT ----- MRI of brain reviewed.  It shows chronic vascular changes related to history of high blood pressure and possibly high cholesterol.  Some of these changes may be contributing to balance problems.  Recommendation is to treat risk factors (medication for blood pressure and cholesterol) and taking a blood thinner such as aspirin.  I have no further recommendations at this time.

## 2020-01-27 NOTE — Telephone Encounter (Signed)
Pt advised of his MRI results and the recommendations.

## 2020-01-29 ENCOUNTER — Other Ambulatory Visit: Payer: Medicare Other

## 2020-02-01 ENCOUNTER — Other Ambulatory Visit: Payer: Self-pay | Admitting: Family Medicine

## 2020-02-01 DIAGNOSIS — I10 Essential (primary) hypertension: Secondary | ICD-10-CM

## 2020-02-10 ENCOUNTER — Ambulatory Visit (INDEPENDENT_AMBULATORY_CARE_PROVIDER_SITE_OTHER): Payer: Medicare Other | Admitting: Family Medicine

## 2020-02-10 ENCOUNTER — Other Ambulatory Visit: Payer: Self-pay

## 2020-02-10 ENCOUNTER — Encounter: Payer: Self-pay | Admitting: Family Medicine

## 2020-02-10 VITALS — BP 142/80 | HR 81 | Temp 98.2°F | Ht 72.0 in | Wt 236.0 lb

## 2020-02-10 DIAGNOSIS — I10 Essential (primary) hypertension: Secondary | ICD-10-CM | POA: Diagnosis not present

## 2020-02-10 DIAGNOSIS — R2689 Other abnormalities of gait and mobility: Secondary | ICD-10-CM

## 2020-02-10 NOTE — Patient Instructions (Addendum)
Keep the diet clean and stay active.  If you do not hear anything about your referral in the next 1-2 weeks, call our office and ask for an update.  I want your blood pressure <150/90 consistently. Let me know if it spikes up routinely and we will have you in for a nurse blood pressure check.  Check 2-3 times per week. Alternate the times of day you check it.   Let us know if you need anything.

## 2020-02-10 NOTE — Progress Notes (Signed)
Chief Complaint  Patient presents with  . Follow-up    MRI results/medication    Subjective Jamie Sparks is a 64 y.o. male who presents for hypertension follow up. Here w his wife.  He does monitor home blood pressures. Blood pressures ranging from 130-140's/80's on average. He is compliant with medications- HCTZ 25 mg/d, losartan 100 mg/d. Patient has these side effects of medication: none He is adhering to a healthy diet overall. Current exercise: walking, wt resistance exercising  Pt w hx of balance issues over past 2 years. Saw neuro with unremarkable workup. Wondering what the next steps are. He seems to be uncoordinated per his wife.    Past Medical History:  Diagnosis Date  . Hypertension     Exam BP (!) 142/80 (BP Location: Left Arm, Patient Position: Sitting, Cuff Size: Normal)   Pulse 81   Temp 98.2 F (36.8 C) (Oral)   Ht 6' (1.829 m)   Wt 236 lb (107 kg)   SpO2 97%   BMI 32.01 kg/m  General:  well developed, well nourished, in no apparent distress Heart: RRR, no bruits, no LE edema Lungs: clear to auscultation, no accessory muscle use Psych: well oriented with normal range of affect and appropriate judgment/insight  Balance problem  Essential hypertension  1. Will set up with PT. Does not appear to be a neurologic issue thankfully.  2. Cont losartan 100 mg/d, HCTZ 25 mg/d. Counseled on diet and exercise. F/u in 6 mo or prn. The patient and his wife voiced understanding and agreement to the plan.  Jilda Roche Ramsey, DO 02/10/20  1:58 PM

## 2020-02-18 ENCOUNTER — Other Ambulatory Visit: Payer: Self-pay

## 2020-02-18 ENCOUNTER — Ambulatory Visit (INDEPENDENT_AMBULATORY_CARE_PROVIDER_SITE_OTHER): Payer: Medicare Other | Admitting: Rehabilitative and Restorative Service Providers"

## 2020-02-18 DIAGNOSIS — R2681 Unsteadiness on feet: Secondary | ICD-10-CM | POA: Diagnosis not present

## 2020-02-18 DIAGNOSIS — R2689 Other abnormalities of gait and mobility: Secondary | ICD-10-CM | POA: Diagnosis not present

## 2020-02-18 DIAGNOSIS — M6281 Muscle weakness (generalized): Secondary | ICD-10-CM | POA: Diagnosis not present

## 2020-02-18 NOTE — Therapy (Signed)
[facility] [place] [address] [place], [place], [address] Phone: [phone]   Fax:  [phone]  Physical Therapy Evaluation  Patient Details  Name: KELYN KOSKELA MRN: [id number] Date of Birth: Jul 07, 1955 Referring Provider (PT): [name], [place]   Encounter Date: 02/18/2020   PT End of Session - 02/18/20 1003    Visit Number 1    Number of Visits 12    Date for PT Re-Evaluation 03/31/20    Authorization Type medicare    PT Start Time 0910    PT Stop Time 0951    PT Time Calculation (min) 41 min           Past Medical History:  Diagnosis Date  . Hypertension     No past surgical history on file.  There were no vitals filed for this visit.    Subjective Assessment - 02/18/20 0907    Subjective The patient reports that he had a sudden onset of imbalance approximately 2 years ago.  He reports a sensation of falling and notes some difficulty getting up steps.  He works out daily.  He has not fallen and reports he is able to catch himself.    Pertinent History HTN    Patient Stated Goals Improve balance to get back to biking and riding motorcycle    Currently in Pain? No/denies              [facility] PT Assessment - 02/18/20 0910      Assessment   Medical Diagnosis imbalance    Referring Provider (PT) [name], DO    Onset Date/Surgical Date 02/10/20    Hand Dominance Right    Prior Therapy none      Precautions   Precautions Fall      Restrictions   Weight Bearing Restrictions No      Balance Screen   Has the patient fallen in the past 6 months No    Has the patient had a decrease in activity level because of a fear of falling?  Yes   not ablew to ride his bike or motorcycle   Is the patient reluctant to leave their home because of a fear of falling?  No      Home Environment   Living Environment Private residence    Living Arrangements Spouse/significant other    Home Access  Level entry    Home Layout One level    Home Equipment None      Prior Function   Level of Independence Independent    Vocation Retired    Gaffer retired from Airline pilot      Observation/Other Assessments   Observations reports vision changes of new lenses last year; denies hearing changes, denies headaches.        Sensation   Light Touch Appears Intact      Coordination   Gross Motor Movements are Fluid and Coordinated Yes    Finger Nose Finger Test intact; rapid alternating movements are slow but equal bilaterally    Heel Shin Test intact and equal bilaterally      Posture/Postural Control   Posture/Postural Control Postural limitations      ROM / Strength   AROM / PROM / Strength AROM;Strength      AROM   Overall AROM  Within functional limits for tasks performed      Strength   Overall Strength Deficits    Strength Assessment Site Shoulder;Elbow;Hip;Knee;Ankle    Right/Left Shoulder Right;Left  Right Shoulder Flexion 5/5    Right Shoulder ABduction 5/5    Left Shoulder Flexion 5/5    Left Shoulder ABduction 5/5    Right/Left Hip Right;Left    Right Hip Flexion 4/5    Left Hip Flexion 4/5    Right/Left Knee Right;Left    Right Knee Flexion 5/5    Right Knee Extension 5/5    Left Knee Flexion 5/5    Left Knee Extension 5/5    Right/Left Ankle Right;Left    Right Ankle Dorsiflexion 4/5    Left Ankle Dorsiflexion 4/5      Flexibility   Soft Tissue Assessment /Muscle Length yes      Transfers   Transfers Sit to Stand;Stand to Sit    Sit to Stand 7: Independent;Without upper extremity assist    Five time sit to stand comments  25.43 seconds      Ambulation/Gait   Ambulation/Gait Yes    Ambulation/Gait Assistance 7: Independent    Ambulation Distance (Feet) 150 Feet    Assistive device None    Ambulation Surface Level;Indoor    Stairs Yes    Stairs Assistance 6: Modified independent (Device/Increase time)    Stair Management Technique  Alternating pattern;Two rails    Number of Stairs 8      Standardized Balance Assessment   Standardized Balance Assessment Berg Balance Test;Timed Up and Go Test      Berg Balance Test   Sit to Stand Able to stand without using hands and stabilize independently    Standing Unsupported Able to stand safely 2 minutes    Sitting with Back Unsupported but Feet Supported on Floor or Stool Able to sit safely and securely 2 minutes    Stand to Sit Sits safely with minimal use of hands    Transfers Able to transfer safely, minor use of hands    Standing Unsupported with Eyes Closed Able to stand 10 seconds safely    Standing Unsupported with Feet Together Able to place feet together independently and stand 1 minute safely    From Standing, Reach Forward with Outstretched Arm Can reach forward >12 cm safely (5")    From Standing Position, Pick up Object from Floor Able to pick up shoe safely and easily    From Standing Position, Turn to Look Behind Over each Shoulder Looks behind from both sides and weight shifts well    Turn 360 Degrees Able to turn 360 degrees safely but slowly    Standing Unsupported, Alternately Place Feet on Step/Stool Able to stand independently and safely and complete 8 steps in 20 seconds    Standing Unsupported, One Foot in Front Able to take small step independently and hold 30 seconds    Standing on One Leg Tries to lift leg/unable to hold 3 seconds but remains standing independently    Total Score 48    Berg comment: 48/56      Timed Up and Go Test   TUG --   14.94 seconds      High Level Balance   High Level Balance Comments compliant surface standing with eyes open, needs min A due to posterior leaning;  Can do eyes closed x 10 seconds       Functional Gait  Assessment   Gait assessed  Yes    Gait Level Surface Walks 20 ft in less than 7 sec but greater than 5.5 sec, uses assistive device, slower speed, mild gait deviations, or deviates 6-10 in outside of the  12  in walkway width.    Change in Gait Speed Able to change speed, demonstrates mild gait deviations, deviates 6-10 in outside of the 12 in walkway width, or no gait deviations, unable to achieve a major change in velocity, or uses a change in velocity, or uses an assistive device.    Gait with Horizontal Head Turns Performs head turns smoothly with slight change in gait velocity (eg, minor disruption to smooth gait path), deviates 6-10 in outside 12 in walkway width, or uses an assistive device.    Gait with Vertical Head Turns Performs task with slight change in gait velocity (eg, minor disruption to smooth gait path), deviates 6 - 10 in outside 12 in walkway width or uses assistive device    Gait and Pivot Turn Turns slowly, requires verbal cueing, or requires several small steps to catch balance following turn and stop    Step Over Obstacle Is able to step over one shoe box (4.5 in total height) but must slow down and adjust steps to clear box safely. May require verbal cueing.    Gait with Narrow Base of Support Ambulates less than 4 steps heel to toe or cannot perform without assistance.    Gait with Eyes Closed Cannot walk 20 ft without assistance, severe gait deviations or imbalance, deviates greater than 15 in outside 12 in walkway width or will not attempt task.    Ambulating Backwards Walks 20 ft, slow speed, abnormal gait pattern, evidence for imbalance, deviates 10-15 in outside 12 in walkway width.    Steps Alternating feet, must use rail.    Total Score 13    FGA comment: 13/30                  Vestibular Assessment - 02/18/20 0921      Vestibular Assessment   General Observation No h/o dizziness.  "I feel like I'm going to fall backwards and I dont' know why."      Oculomotor Exam   Oculomotor Alignment Normal    Ocular ROM WFLs    Spontaneous Absent    Gaze-induced  Absent    Smooth Pursuits Intact    Saccades Intact      Vestibulo-Ocular Reflex   VOR 1 Head Only (x  1 viewing) Unable to perform:  cannot keep focus on targer    Comment attempted head impulse test= unable to allow PT to move his head; he has full AROM c-spine rotation              Objective measurements completed on examination: See above findings.       [facility] Adult PT Treatment/Exercise - 02/18/20 0910      Neuro Re-ed    Neuro Re-ed Details  Corner standing with vertical head motion with 1/2 tandem stance, horizontal motion with 1/2 tandem stance, eyes closed with feet together and compliant surface with eyes open.   Single leg stance.  Gaze adaptation x 1 viewing with cues on technique.                  PT Education - 02/18/20 1002    Education Details HEP    Person(s) Educated Patient    Methods Explanation;Demonstration;Handout    Comprehension Returned demonstration;Verbalized understanding               PT Long Term Goals - 02/18/20 1003      PT LONG TERM GOAL #1   Title The patient will be indep with HEP  for balance, LE strengthening, flexibility.    Time 6    Period Weeks    Target Date 03/31/20      PT LONG TERM GOAL #2   Title The patient will improve FGA from 13/30 to > or equal to 18/30 to demo improving dynamic balance control.    Time 6    Period Weeks    Target Date 03/31/20      PT LONG TERM GOAL #3   Title The patient will maintain foam standing + eyes closed x 30 seconds to demo ability to maintain multi-sensory balance situations.    Time 6    Period Weeks    Target Date 03/31/20      PT LONG TERM GOAL #4   Title The patient will move floor<>stand with UE support through ground mod indep.    Time 6    Period Weeks    Target Date 03/31/20      PT LONG TERM GOAL #5   Title The patient will tolerate gaze adaptation x 30 seconds with continuous head motion.    Time 6    Period Weeks    Target Date 03/31/20                  Plan - 02/18/20 1005    Clinical Impression Statement The patient is a 64 yo male presenting  to OP physical therapy with 2 year h/o imbalance.  He has undergone extensive neurologic workup.  He presents to PT with impairments in bilateral hip and ankle DF strength, tightness bilat HS, significantly diminished veestibular ocular reflex per difficulty maintaining gaze with head motion, decreased dynamic gait control with 13/30 on FGA.  These impairments limit his ability to get up from the floor, ride his motorcycle, ride his bike, and complete recreational and IADL activities.  During evaluation, patient needed some demonstration to follow instructions.    Personal Factors and Comorbidities Comorbidity 1    Comorbidities HTN    Examination-Activity Limitations Locomotion Level;Stand    Examination-Participation Restrictions Community Activity    Stability/Clinical Decision Making Stable/Uncomplicated    Clinical Decision Making Low    Rehab Potential Good    PT Frequency 2x / week    PT Duration 6 weeks    PT Treatment/Interventions ADLs/Self Care Home Management;Gait training;Stair training;Functional mobility training;Therapeutic activities;Therapeutic exercise;Balance training;Neuromuscular re-education;Taping;Patient/family education;Manual techniques;Vestibular;Canalith Repostioning    PT Next Visit Plan dynamic gait activities with head turns, standing eyes closed + head motion, progress VOR, progress single leg stance, hip flexion + ankle DF strength, and multi-sensory balance.    PT Home Exercise Plan Access Code: [email or web address]    Consulted and Agree with Plan of Care Patient           Patient will benefit from skilled therapeutic intervention in order to improve the following deficits and impairments:  Abnormal gait, Difficulty walking, Decreased balance, Postural dysfunction, Impaired flexibility, Decreased strength, Impaired perceived functional ability  Visit Diagnosis: Other abnormalities of gait and mobility  Unsteadiness on feet  Muscle weakness  (generalized)     Problem List Patient Active Problem List   Diagnosis Date Noted  . Essential hypertension 02/10/2020  . Obesity (BMI 30.0-34.9) 02/11/2019  . Sinusitis 09/21/2011  . Right hip pain 06/23/2011  . Right ankle pain 06/23/2011  . Back pain 02/02/2011  . Murmur 01/27/2011  . Musculoskeletal leg pain 10/26/2010  . HTN (hypertension) 10/26/2010    Andreika Vandagriff, PT 02/18/2020, 10:09 AM  [facility] [address] [place], [place], [address] Phone: [phone]   Fax:  [phone]  Name: AGUSTUS MANE MRN: [id number] Date of Birth: 11/18/1955

## 2020-02-18 NOTE — Patient Instructions (Signed)
Access Code: [email or web address] URL: [email or web address] Date: 02/18/2020 Prepared by: Margretta Ditty  Exercises Romberg Stance Eyes Closed on Foam Pad - 2 x daily - 7 x weekly - 1 sets - 3 reps - 30 seconds hold Romberg Stance on Foam Pad with Head Rotation - 2 x daily - 7 x weekly - 1 sets - 10 reps Romberg Stance with Head Nods on Foam Pad - 2 x daily - 7 x weekly - 1 sets - 10 reps Standing Gaze Stabilization with Head Rotation - 2 x daily - 7 x weekly - 1 sets - 1 reps Single Leg Stance with Support - 2 x daily - 7 x weekly - 1 sets - 3 reps - 10 seconds hold

## 2020-02-19 ENCOUNTER — Ambulatory Visit: Payer: Medicare Other | Admitting: Neurology

## 2020-02-24 ENCOUNTER — Other Ambulatory Visit: Payer: Self-pay

## 2020-02-24 ENCOUNTER — Encounter: Payer: Self-pay | Admitting: Rehabilitative and Restorative Service Providers"

## 2020-02-24 ENCOUNTER — Ambulatory Visit (INDEPENDENT_AMBULATORY_CARE_PROVIDER_SITE_OTHER): Payer: Medicare Other | Admitting: Rehabilitative and Restorative Service Providers"

## 2020-02-24 DIAGNOSIS — R2681 Unsteadiness on feet: Secondary | ICD-10-CM

## 2020-02-24 DIAGNOSIS — M6281 Muscle weakness (generalized): Secondary | ICD-10-CM | POA: Diagnosis not present

## 2020-02-24 DIAGNOSIS — R2689 Other abnormalities of gait and mobility: Secondary | ICD-10-CM

## 2020-02-24 NOTE — Therapy (Addendum)
[facility] [place] [address] [place], [place], [address] Phone: [phone]   Fax:  [phone]  Physical Therapy Treatment and Discharge Summary  Patient Details  Name: Jamie Sparks MRN: [id number] Date of Birth: 1955/05/28 Referring Provider (PT): [name], DO     Patient did not return to PT.  Please refer to initial evaluation for patient status. Thank you for the referral of this patient. Margretta Ditty, MPT   Encounter Date: 02/24/2020   PT End of Session - 02/24/20 1318    Visit Number 2    Number of Visits 12    Date for PT Re-Evaluation 03/31/20    Authorization Type medicare    PT Start Time [phone]    PT Stop Time 0930    PT Time Calculation (min) 41 min    Activity Tolerance Patient tolerated treatment well    Behavior During Therapy Impulsive           Past Medical History:  Diagnosis Date  . Hypertension     History reviewed. No pertinent surgical history.  There were no vitals filed for this visit.   Subjective Assessment - 02/24/20 0854    Subjective The patient reports he was falling off the foam pad with eyes closed.  He adjusted medications due to how he was feeling.  PT recommended he call MD office and not self adjust meds.    Pertinent History HTN    Patient Stated Goals Improve balance to get back to biking and riding motorcycle    Currently in Pain? No/denies                             [facility] Adult PT Treatment/Exercise - 02/24/20 0902      Self-Care   Self-Care Other Self-Care Comments    Other Self-Care Comments  PT and patient discussed statements of "I will go stretch for hours" and reports of self medicating/ changing medications; we discussed possible counseling to work through frustrations with change in functional status.      Therapeutic Activites    Therapeutic Activities Other Therapeutic Activities    Other Therapeutic Activities  floor transfers working on technique;  patient needs surface to press through and has loss of balance anteriorly after rising needing min A to recover      Neuro Re-ed    Neuro Re-ed Details  Tall kneeling<>1/2 kneeling wiht UE support; 1/2 kneeling with proactive balance with reaching overhead. Compliant surface standing with eyes closed; foam with head motion.  Eyes closed on solid surfaces.      Exercises   Exercises Other Exercises;Lumbar    Other Exercises  heel sitting<>up dog for lumbar ROM and transition between positions; alternating tall knee ot 1/2 kneel for hip strengthening      Lumbar Exercises: Stretches   Passive Hamstring Stretch Right;Left;1 rep;30 seconds    Lower Trunk Rotation 2 reps;30 seconds    Quadruped Mid Back Stretch 3 reps;20 seconds    Quad Stretch Right;Left;2 reps;30 seconds    Other Lumbar Stretch Exercise standing trunk rotation with hands on elevated mat surface    Other Lumbar Stretch Exercise hip adductor stretch      Lumbar Exercises: Quadruped   Straight Leg Raise 5 reps    Opposite Arm/Leg Raise Right arm/Left leg;Left arm/Right leg;5 reps    Opposite Arm/Leg Raise Limitations cues for technique    Other Quadruped Lumbar Exercises prone on elbows  with alternating UE reaching             [facility]) - 02/24/20 0926    PWR! Up 10 reps    PWR! Rock 10 reps    PWR! Twist 10 reps    Reaching Comments 10 reps               PT Education - 02/24/20 1318    Education Details HEP progression    Person(s) Educated Patient    Methods Explanation;Demonstration;Handout    Comprehension Verbalized understanding;Returned demonstration               PT Long Term Goals - 02/18/20 1003      PT LONG TERM GOAL #1   Title The patient will be indep with HEP for balance, LE strengthening, flexibility.    Time 6    Period Weeks    Target Date 03/31/20      PT LONG TERM GOAL #2   Title The patient will improve FGA from 13/30 to > or equal to  18/30 to demo improving dynamic balance control.    Time 6    Period Weeks    Target Date 03/31/20      PT LONG TERM GOAL #3   Title The patient will maintain foam standing + eyes closed x 30 seconds to demo ability to maintain multi-sensory balance situations.    Time 6    Period Weeks    Target Date 03/31/20      PT LONG TERM GOAL #4   Title The patient will move floor<>stand with UE support through ground mod indep.    Time 6    Period Weeks    Target Date 03/31/20      PT LONG TERM GOAL #5   Title The patient will tolerate gaze adaptation x 30 seconds with continuous head motion.    Time 6    Period Weeks    Target Date 03/31/20                 Plan - 02/24/20 1319    Clinical Impression Statement The patient appears impulsive t/o treatment and comments on making changes in medications.  PT discussed med changes should be made with his MD's input. PT focused on activities to work on weight shifting, large amplitude movements and functional floor transfers.  Continue working to patient tolerance.    Stability/Clinical Decision Making Stable/Uncomplicated    Rehab Potential Good    PT Frequency 2x / week    PT Duration 6 weeks    PT Treatment/Interventions ADLs/Self Care Home Management;Gait training;Stair training;Functional mobility training;Therapeutic activities;Therapeutic exercise;Balance training;Neuromuscular re-education;Taping;Patient/family education;Manual techniques;Vestibular;Canalith Repostioning    PT Next Visit Plan dynamic gait activities with head turns, standing eyes closed + head motion, progress VOR, progress single leg stance, hip flexion + ankle DF strength, and multi-sensory balance.    PT Home Exercise Plan Access Code: [email or web address]    Consulted and Agree with Plan of Care Patient           Patient will benefit from skilled therapeutic intervention in order to improve the following deficits and impairments:  Abnormal gait, Difficulty walking,  Decreased balance, Postural dysfunction, Impaired flexibility, Decreased strength, Impaired perceived functional ability  Visit Diagnosis: Other abnormalities of gait and mobility  Unsteadiness on feet  Muscle weakness (generalized)     Problem List Patient Active Problem List   Diagnosis Date Noted  . Essential hypertension 02/10/2020  . Obesity (BMI 30.0-34.9) 02/11/2019  .  Sinusitis 09/21/2011  . Right hip pain 06/23/2011  . Right ankle pain 06/23/2011  . Back pain 02/02/2011  . Murmur 01/27/2011  . Musculoskeletal leg pain 10/26/2010  . HTN (hypertension) 10/26/2010    Cadyn Fann, PT 02/24/2020, 1:26 PM  [facility] [address] [place], [place], [address] Phone: [phone]   Fax:  [phone]  Name: ASAHEL RISDEN MRN: [id number] Date of Birth: 1956/01/27

## 2020-02-24 NOTE — Patient Instructions (Signed)
Access Code: [email or web address] URL: [email or web address] Date: 02/24/2020 Prepared by: Margretta Ditty  Exercises Romberg Stance Eyes Closed on Foam Pad - 2 x daily - 7 x weekly - 1 sets - 3 reps - 30 seconds hold Romberg Stance on Foam Pad with Head Rotation - 2 x daily - 7 x weekly - 1 sets - 10 reps Romberg Stance with Head Nods on Foam Pad - 2 x daily - 7 x weekly - 1 sets - 10 reps Standing Gaze Stabilization with Head Rotation - 2 x daily - 7 x weekly - 1 sets - 1 reps Single Leg Stance with Support - 2 x daily - 7 x weekly - 1 sets - 3 reps - 10 seconds hold Standing Reach to Opposite Side with Weight Shift - 2 x daily - 7 x weekly - 1 sets - 10 reps Seated Hamstring Stretch - 2 x daily - 7 x weekly - 1 sets - 3 reps - 20 seconds hold Supine Lower Trunk Rotation - 2 x daily - 7 x weekly - 1 sets - 5 reps - 15 seconds hold

## 2020-03-03 ENCOUNTER — Ambulatory Visit: Payer: Medicare Other | Admitting: Family Medicine

## 2020-03-03 ENCOUNTER — Encounter: Payer: Medicare Other | Admitting: Rehabilitative and Restorative Service Providers"

## 2020-03-05 ENCOUNTER — Other Ambulatory Visit: Payer: Self-pay

## 2020-03-05 ENCOUNTER — Encounter: Payer: Medicare Other | Admitting: Rehabilitative and Restorative Service Providers"

## 2020-03-05 ENCOUNTER — Ambulatory Visit (INDEPENDENT_AMBULATORY_CARE_PROVIDER_SITE_OTHER): Payer: Medicare Other | Admitting: Family Medicine

## 2020-03-05 ENCOUNTER — Encounter: Payer: Self-pay | Admitting: Family Medicine

## 2020-03-05 VITALS — BP 168/94 | HR 75 | Temp 98.4°F | Ht 72.0 in | Wt 241.0 lb

## 2020-03-05 DIAGNOSIS — H538 Other visual disturbances: Secondary | ICD-10-CM | POA: Diagnosis not present

## 2020-03-05 DIAGNOSIS — I10 Essential (primary) hypertension: Secondary | ICD-10-CM | POA: Diagnosis not present

## 2020-03-05 NOTE — Patient Instructions (Addendum)
Keep the diet clean and stay active.  Check your blood pressures 2-3 times per week, alternating the time of day you check it. If it is high, considering waiting 1-2 minutes and rechecking. If it gets higher, your anxiety is likely creeping up and we should avoid rechecking.   Get back into the swing of things with your blood pressure.   Goal blood pressure medication is <150/90.  Reach out to your eye provider again.   Let us know if you need anything.

## 2020-03-05 NOTE — Progress Notes (Signed)
Chief Complaint  Patient presents with  . Blurred Vision    balance problems and concentration problems  . Back Pain    Subjective Jamie Sparks is a 64 y.o. male who presents for hypertension follow up. He does monitor home blood pressures. Blood pressures ranging from 140's/80's on average. He is compliant with medications- HCTZ 25 mg/d, losartan 100 mg/d. Patient has these side effects of medication: blurred vision- several months; wearing old He is adhering to a healthy diet overall. Current exercise: cycling, walking, wt lifting   Past Medical History:  Diagnosis Date  . Hypertension     Exam BP (!) 168/94 (BP Location: Left Arm, Patient Position: Sitting, Cuff Size: Normal)   Pulse 75   Temp 98.4 F (36.9 C) (Oral)   Ht 6' (1.829 m)   Wt 241 lb (109.3 kg)   SpO2 94%   BMI 32.69 kg/m  General:  well developed, well nourished, in no apparent distress Eyes: sclera white, I do not appreciate any papilledema Heart: RRR, no bruits, no LE edema Lungs: clear to auscultation, no accessory muscle use Psych: well oriented with normal range of affect and appropriate judgment/insight  Essential hypertension  Status: Uncontrolled.  Continue losartan 100 mg daily and hydrochlorothiazide 25 mg daily.  Continue checking blood pressure at home.  Get back into routine exercise.  Send me readings in 2-3 weeks, if still having issues we will add Coreg back.  He will follow-up as originally scheduled otherwise. The patient voiced understanding and agreement to the plan.  Jilda Roche [place], DO 03/05/20  2:53 PM

## 2020-03-10 ENCOUNTER — Telehealth: Payer: Self-pay | Admitting: Family Medicine

## 2020-03-10 MED ORDER — LISINOPRIL 20 MG PO TABS: 20.0000 mg | ORAL_TABLET | Freq: Every day | ORAL | 3 refills | Status: DC

## 2020-03-10 NOTE — Telephone Encounter (Signed)
Yes he has been in it for a while. He does think it is the cozaar. He said he has had the balance problem since he started the medication. He went to the gym today and could not remember where to turn to go home.

## 2020-03-10 NOTE — Telephone Encounter (Signed)
Called informed the patient of new medication sent in.

## 2020-03-10 NOTE — Telephone Encounter (Signed)
Sent!

## 2020-03-10 NOTE — Telephone Encounter (Signed)
Pt says his BP is good but the side effects of the Cozaar are terrible. Memory issues and balance are really off and he feel it is attributed to the medication. New rx can be called to [facility] in [place].

## 2020-03-10 NOTE — Telephone Encounter (Signed)
Are we sure it is that medicine? He has been on it for a while, no? I can send in a new medicine, but want to be sure. Ty.

## 2020-03-26 MED ORDER — VALSARTAN 80 MG PO TABS: 80.0000 mg | ORAL_TABLET | Freq: Every day | ORAL | 3 refills | Status: DC

## 2020-03-26 NOTE — Telephone Encounter (Signed)
Patient called to state the blood pressure medication (lisinopril)  is not agreeing with him. Patient states he is weak and when going to the gym this morning he could hardly get going. Patient is requesting another medication be sent to his pharmacy. Patient also states medication is making him unbearable, his gets aggrieved easy now

## 2020-03-26 NOTE — Addendum Note (Signed)
Addended by: Radene Gunning on: 03/26/2020 01:42 PM   Modules accepted: Orders

## 2020-03-26 NOTE — Telephone Encounter (Signed)
Called informed the patient of medication sent in.

## 2020-03-26 NOTE — Telephone Encounter (Signed)
Sent in low dose of valsartan. Ty.

## 2020-04-12 ENCOUNTER — Other Ambulatory Visit: Payer: Self-pay

## 2020-04-12 ENCOUNTER — Ambulatory Visit (INDEPENDENT_AMBULATORY_CARE_PROVIDER_SITE_OTHER): Payer: Medicare Other | Admitting: Family Medicine

## 2020-04-12 ENCOUNTER — Encounter: Payer: Self-pay | Admitting: Family Medicine

## 2020-04-12 VITALS — BP 142/100 | HR 81 | Temp 98.2°F | Ht 72.0 in | Wt 241.2 lb

## 2020-04-12 DIAGNOSIS — I10 Essential (primary) hypertension: Secondary | ICD-10-CM

## 2020-04-12 MED ORDER — VALSARTAN 160 MG PO TABS: 160.0000 mg | ORAL_TABLET | Freq: Every day | ORAL | 2 refills | Status: DC

## 2020-04-12 NOTE — Patient Instructions (Signed)
Keep checking your blood pressure at home.  Take 2 tabs of the 80 mg valsartan until you run out.  A new dosage has been sent in. Send me a message in 2 weeks with your blood pressure readings and let me know how you are tolerating the higher dosage.   Continue the hydrochlorothiazide at the current dosage.   Keep the diet clean and stay active.  Let us know if you need anything.

## 2020-04-12 NOTE — Progress Notes (Signed)
Chief Complaint  Patient presents with  . Follow-up    Subjective Jamie Sparks is a 64 y.o. male who presents for hypertension follow up. He does monitor home blood pressures. Blood pressures ranging from 140's/90's on average. He is compliant with medications- HCTZ 25 mg/d, Diovan 80 mg/d . Patient has these side effects of medication: none He is adhering to a healthy diet overall. Current exercise: cycling, walking   Past Medical History:  Diagnosis Date  . Hypertension     Exam BP (!) 142/100 (BP Location: Right Arm, Patient Position: Sitting, Cuff Size: Normal)   Pulse 81   Temp 98.2 F (36.8 C) (Oral)   Ht 6' (1.829 m)   Wt 241 lb 4 oz (109.4 kg)   SpO2 98%   BMI 32.72 kg/m  General:  well developed, well nourished, in no apparent distress Heart: RRR, no bruits, no LE edema Lungs: clear to auscultation, no accessory muscle use Psych: well oriented with normal range of affect and appropriate judgment/insight  Essential hypertension - Plan: valsartan (DIOVAN) 160 MG tablet  Status: Chronic, uncontrolled. Increase dosage of Diovan from 80 mg/d to 160 mg/d. Cont to monitor BP at home, send message in 2 weeks with readings and how he is tolerating. Could increase to 320 mg/d. Counseled on diet and exercise. F/u in 1 mo; will consider addition of spironolactone, will need to monitor lytes and renal function. The patient voiced understanding and agreement to the plan.  Jilda Roche [place], DO 04/12/20  9:30 AM

## 2020-05-14 ENCOUNTER — Ambulatory Visit (INDEPENDENT_AMBULATORY_CARE_PROVIDER_SITE_OTHER): Payer: Medicare Other | Admitting: Family Medicine

## 2020-05-14 ENCOUNTER — Encounter: Payer: Self-pay | Admitting: Family Medicine

## 2020-05-14 ENCOUNTER — Other Ambulatory Visit: Payer: Self-pay

## 2020-05-14 VITALS — BP 142/92 | HR 88 | Temp 98.4°F | Ht 72.0 in | Wt 235.0 lb

## 2020-05-14 DIAGNOSIS — I10 Essential (primary) hypertension: Secondary | ICD-10-CM

## 2020-05-14 DIAGNOSIS — L309 Dermatitis, unspecified: Secondary | ICD-10-CM

## 2020-05-14 MED ORDER — TRIAMCINOLONE ACETONIDE 0.5 % EX CREA: 1.0000 "application " | TOPICAL_CREAM | Freq: Two times a day (BID) | CUTANEOUS | 0 refills | Status: DC

## 2020-05-14 MED ORDER — CARVEDILOL 25 MG PO TABS: 25.0000 mg | ORAL_TABLET | Freq: Two times a day (BID) | ORAL | 1 refills | Status: DC

## 2020-05-14 MED ORDER — VALSARTAN 80 MG PO TABS: 80.0000 mg | ORAL_TABLET | Freq: Every day | ORAL | 2 refills | Status: DC

## 2020-05-14 NOTE — Progress Notes (Signed)
Chief Complaint  Patient presents with  . Follow-up    Subjective Jamie Sparks is a 65 y.o. male who presents for hypertension follow up. He does monitor home blood pressures. Blood pressures ranging from 140's/90's on average. He is compliant with medications- HCTZ 25 mg/d, Diovan 80 mg/d (tried to go on 160 mg/d and could not tolerate). Patient has these side effects of medication: none He is adhering to a healthy diet overall. Current exercise: cycling, walking, weight lifting No CP or SOB.    Past Medical History:  Diagnosis Date  . Hypertension     Exam BP (!) 142/92 (BP Location: Left Arm, Patient Position: Sitting, Cuff Size: Normal)   Pulse 88   Temp 98.4 F (36.9 C) (Oral)   Ht 6' (1.829 m)   Wt 235 lb (106.6 kg)   SpO2 98%   BMI 31.87 kg/m  General:  well developed, well nourished, in no apparent distress Heart: RRR, no bruits, no LE edema Lungs: clear to auscultation, no accessory muscle use Psych: well oriented with normal range of affect and appropriate judgment/insight  Essential hypertension - Plan: valsartan (DIOVAN) 80 MG tablet, carvedilol (COREG) 25 MG tablet  Eczema, unspecified type - Plan: triamcinolone cream (KENALOG) 0.5 %  1. Status: Chronic, uncontrolled. Cont Diovan 80 mg/d. Stop HCTZ 25 mg/d due to increased urination. Start Coreg. send message in 2 weeks with readings. Will consider bid hydralazine. Counseled on diet and exercise. 2. Refill.  F/u in 1 mo. The patient voiced understanding and agreement to the plan.  Jilda Roche [place], DO 05/14/20  9:12 AM

## 2020-05-14 NOTE — Patient Instructions (Addendum)
Send me a message in 2 weeks with your readings and we can make some adjustments.   We are stopping the hydrochlorothiazide.   Keep the diet clean and stay active.  Continue to check your blood pressure at home.  Let us know if you need anything.

## 2020-06-14 ENCOUNTER — Encounter: Payer: Self-pay | Admitting: Family Medicine

## 2020-06-14 ENCOUNTER — Ambulatory Visit (INDEPENDENT_AMBULATORY_CARE_PROVIDER_SITE_OTHER): Payer: Medicare Other | Admitting: Family Medicine

## 2020-06-14 ENCOUNTER — Other Ambulatory Visit: Payer: Self-pay

## 2020-06-14 VITALS — BP 120/88 | HR 65 | Temp 98.2°F | Ht 72.0 in | Wt 246.4 lb

## 2020-06-14 DIAGNOSIS — I10 Essential (primary) hypertension: Secondary | ICD-10-CM | POA: Diagnosis not present

## 2020-06-14 MED ORDER — HYDRALAZINE HCL 25 MG PO TABS: 25.0000 mg | ORAL_TABLET | Freq: Two times a day (BID) | ORAL | 2 refills | Status: DC

## 2020-06-14 NOTE — Patient Instructions (Signed)
Stay active.  Stop the Coreg.  Continue checking your blood pressure. Bring your blood pressure monitor to your next appointment please.   Start tracking your calories.  Let us know if you need anything.

## 2020-06-14 NOTE — Progress Notes (Signed)
Chief Complaint  Patient presents with  . Medication Problem    Subjective Jamie Sparks is a 65 y.o. male who presents for hypertension follow up. He does monitor home blood pressures. Blood pressures ranging from 130-140's/80-90's on average. He is compliant with medications- Valsartan 80 mg/d, Coreg 25 mg bid. Patient has these side effects of medication (Coreg): memory loss, increased urination He is adhering to a healthy diet overall. Current exercise: cycling, walking, wt lifting No CP or SOB.    Past Medical History:  Diagnosis Date  . Hypertension     Exam BP 120/88 (BP Location: Left Arm, Patient Position: Sitting, Cuff Size: Large)   Pulse 65   Temp 98.2 F (36.8 C) (Oral)   Ht 6' (1.829 m)   Wt 246 lb 6 oz (111.8 kg)   SpO2 99%   BMI 33.41 kg/m  General:  well developed, well nourished, in no apparent distress Heart: RRR, no bruits Lungs: clear to auscultation, no accessory muscle use Psych: well oriented with normal range of affect and appropriate judgment/insight  Essential hypertension - Plan: hydrALAZINE (APRESOLINE) 25 MG tablet  Stop Coreg. Start Hydralazine. I think BP is pretty good, just dealing with AE's. Counseled on diet and exercise. I would like him to start checking his calories more closely. I would also like to consider treating BPH vs OAB if he has same issues. Pt will bring monitor to next visit.  F/u in 1 mo. The patient voiced understanding and agreement to the plan.  Jilda Roche [place], DO 06/14/20  9:29 AM

## 2020-06-18 ENCOUNTER — Ambulatory Visit: Payer: Medicare Other | Admitting: Family Medicine

## 2020-06-25 ENCOUNTER — Telehealth: Payer: Self-pay | Admitting: Family Medicine

## 2020-06-25 NOTE — Telephone Encounter (Signed)
Stop hydralazine. Try taking 1.5 tabs of valsartan. Plz update med list. Ty.

## 2020-06-25 NOTE — Telephone Encounter (Signed)
Updated list/called the patient left message to call back.

## 2020-06-25 NOTE — Telephone Encounter (Signed)
Patient states he was prescribed some medication that is making him dizzy and unable to remember much. Patient would like a call back or and adjustment to blood pressure medication.  Please advise

## 2020-06-25 NOTE — Telephone Encounter (Signed)
The patient called back informed of change He verbalized understanding

## 2020-07-12 ENCOUNTER — Ambulatory Visit (INDEPENDENT_AMBULATORY_CARE_PROVIDER_SITE_OTHER): Payer: Medicare Other | Admitting: Family Medicine

## 2020-07-12 ENCOUNTER — Other Ambulatory Visit: Payer: Self-pay

## 2020-07-12 ENCOUNTER — Encounter: Payer: Self-pay | Admitting: Family Medicine

## 2020-07-12 VITALS — BP 152/84 | HR 77 | Temp 98.0°F | Resp 18 | Wt 241.4 lb

## 2020-07-12 DIAGNOSIS — I10 Essential (primary) hypertension: Secondary | ICD-10-CM

## 2020-07-12 DIAGNOSIS — M545 Low back pain, unspecified: Secondary | ICD-10-CM | POA: Diagnosis not present

## 2020-07-12 MED ORDER — DOXAZOSIN MESYLATE 4 MG PO TABS: 4.0000 mg | ORAL_TABLET | Freq: Every day | ORAL | 1 refills | Status: DC

## 2020-07-12 NOTE — Patient Instructions (Signed)
Send me a message with your blood pressure readings in 2 weeks.  Keep the diet clean and stay active.  Continue checking your blood pressure at home.  Heat (pad or rice pillow in microwave) over affected area, 10-15 minutes twice daily.   Ice/cold pack over area for 10-15 min twice daily.  Let us know if you need anything.  EXERCISES  RANGE OF MOTION (ROM) AND STRETCHING EXERCISES - Low Back Pain Most people with lower back pain will find that their symptoms get worse with excessive bending forward (flexion) or arching at the lower back (extension). The exercises that will help resolve your symptoms will focus on the opposite motion.  If you have pain, numbness or tingling which travels down into your buttocks, leg or foot, the goal of the therapy is for these symptoms to move closer to your back and eventually resolve. Sometimes, these leg symptoms will get better, but your lower back pain may worsen. This is often an indication of progress in your rehabilitation. Be very alert to any changes in your symptoms and the activities in which you participated in the 24 hours prior to the change. Sharing this information with your caregiver will allow him or her to most efficiently treat your condition. These exercises may help you when beginning to rehabilitate your injury. Your symptoms may resolve with or without further involvement from your physician, physical therapist or athletic trainer. While completing these exercises, remember:   Restoring tissue flexibility helps normal motion to return to the joints. This allows healthier, less painful movement and activity.  An effective stretch should be held for at least 30 seconds.  A stretch should never be painful. You should only feel a gentle lengthening or release in the stretched tissue. FLEXION RANGE OF MOTION AND STRETCHING EXERCISES:  STRETCH - Flexion, Single Knee to Chest   Lie on a firm bed or floor with both legs extended in front  of you.  Keeping one leg in contact with the floor, bring your opposite knee to your chest. Hold your leg in place by either grabbing behind your thigh or at your knee.  Pull until you feel a gentle stretch in your low back. Hold 30 seconds.  Slowly release your grasp and repeat the exercise with the opposite side. Repeat 2 times. Complete this exercise 3 times per week.   STRETCH - Flexion, Double Knee to Chest  Lie on a firm bed or floor with both legs extended in front of you.  Keeping one leg in contact with the floor, bring your opposite knee to your chest.  Tense your stomach muscles to support your back and then lift your other knee to your chest. Hold your legs in place by either grabbing behind your thighs or at your knees.  Pull both knees toward your chest until you feel a gentle stretch in your low back. Hold 30 seconds.  Tense your stomach muscles and slowly return one leg at a time to the floor. Repeat 2 times. Complete this exercise 3 times per week.   STRETCH - Low Trunk Rotation  Lie on a firm bed or floor. Keeping your legs in front of you, bend your knees so they are both pointed toward the ceiling and your feet are flat on the floor.  Extend your arms out to the side. This will stabilize your upper body by keeping your shoulders in contact with the floor.  Gently and slowly drop both knees together to one side until you  feel a gentle stretch in your low back. Hold for 30 seconds.  Tense your stomach muscles to support your lower back as you bring your knees back to the starting position. Repeat the exercise to the other side. Repeat 2 times. Complete this exercise at least 3 times per week.   EXTENSION RANGE OF MOTION AND FLEXIBILITY EXERCISES:  STRETCH - Extension, Prone on Elbows   Lie on your stomach on the floor, a bed will be too soft. Place your palms about shoulder width apart and at the height of your head.  Place your elbows under your shoulders. If  this is too painful, stack pillows under your chest.  Allow your body to relax so that your hips drop lower and make contact more completely with the floor.  Hold this position for 30 seconds.  Slowly return to lying flat on the floor. Repeat 2 times. Complete this exercise 3 times per week.   RANGE OF MOTION - Extension, Prone Press Ups  Lie on your stomach on the floor, a bed will be too soft. Place your palms about shoulder width apart and at the height of your head.  Keeping your back as relaxed as possible, slowly straighten your elbows while keeping your hips on the floor. You may adjust the placement of your hands to maximize your comfort. As you gain motion, your hands will come more underneath your shoulders.  Hold this position 30 seconds.  Slowly return to lying flat on the floor. Repeat 2 times. Complete this exercise 3 times per week.   RANGE OF MOTION- Quadruped, Neutral Spine   Assume a hands and knees position on a firm surface. Keep your hands under your shoulders and your knees under your hips. You may place padding under your knees for comfort.  Drop your head and point your tailbone toward the ground below you. This will round out your lower back like an angry cat. Hold this position for 30 seconds.  Slowly lift your head and release your tail bone so that your back sags into a large arch, like an old horse.  Hold this position for 30 seconds.  Repeat this until you feel limber in your low back.  Now, find your "sweet spot." This will be the most comfortable position somewhere between the two previous positions. This is your neutral spine. Once you have found this position, tense your stomach muscles to support your low back.  Hold this position for 30 seconds. Repeat 2 times. Complete this exercise 3 times per week.   STRENGTHENING EXERCISES - Low Back Sprain These exercises may help you when beginning to rehabilitate your injury. These exercises should be  done near your "sweet spot." This is the neutral, low-back arch, somewhere between fully rounded and fully arched, that is your least painful position. When performed in this safe range of motion, these exercises can be used for people who have either a flexion or extension based injury. These exercises may resolve your symptoms with or without further involvement from your physician, physical therapist or athletic trainer. While completing these exercises, remember:   Muscles can gain both the endurance and the strength needed for everyday activities through controlled exercises.  Complete these exercises as instructed by your physician, physical therapist or athletic trainer. Increase the resistance and repetitions only as guided.  You may experience muscle soreness or fatigue, but the pain or discomfort you are trying to eliminate should never worsen during these exercises. If this pain does worsen, stop  and make certain you are following the directions exactly. If the pain is still present after adjustments, discontinue the exercise until you can discuss the trouble with your caregiver.  STRENGTHENING - Deep Abdominals, Pelvic Tilt   Lie on a firm bed or floor. Keeping your legs in front of you, bend your knees so they are both pointed toward the ceiling and your feet are flat on the floor.  Tense your lower abdominal muscles to press your low back into the floor. This motion will rotate your pelvis so that your tail bone is scooping upwards rather than pointing at your feet or into the floor. With a gentle tension and even breathing, hold this position for 3 seconds. Repeat 2 times. Complete this exercise 3 times per week.   STRENGTHENING - Abdominals, Crunches   Lie on a firm bed or floor. Keeping your legs in front of you, bend your knees so they are both pointed toward the ceiling and your feet are flat on the floor. Cross your arms over your chest.  Slightly tip your chin down without  bending your neck.  Tense your abdominals and slowly lift your trunk high enough to just clear your shoulder blades. Lifting higher can put excessive stress on the lower back and does not further strengthen your abdominal muscles.  Control your return to the starting position. Repeat 2 times. Complete this exercise 3 times per week.   STRENGTHENING - Quadruped, Opposite UE/LE Lift   Assume a hands and knees position on a firm surface. Keep your hands under your shoulders and your knees under your hips. You may place padding under your knees for comfort.  Find your neutral spine and gently tense your abdominal muscles so that you can maintain this position. Your shoulders and hips should form a rectangle that is parallel with the floor and is not twisted.  Keeping your trunk steady, lift your right hand no higher than your shoulder and then your left leg no higher than your hip. Make sure you are not holding your breath. Hold this position for 30 seconds.  Continuing to keep your abdominal muscles tense and your back steady, slowly return to your starting position. Repeat with the opposite arm and leg. Repeat 2 times. Complete this exercise 3 times per week.   STRENGTHENING - Abdominals and Quadriceps, Straight Leg Raise   Lie on a firm bed or floor with both legs extended in front of you.  Keeping one leg in contact with the floor, bend the other knee so that your foot can rest flat on the floor.  Find your neutral spine, and tense your abdominal muscles to maintain your spinal position throughout the exercise.  Slowly lift your straight leg off the floor about 6 inches for a count of 3, making sure to not hold your breath.  Still keeping your neutral spine, slowly lower your leg all the way to the floor. Repeat this exercise with each leg 2 times. Complete this exercise 3 times per week.  POSTURE AND BODY MECHANICS CONSIDERATIONS - Low Back Sprain Keeping correct posture when  sitting, standing or completing your activities will reduce the stress put on different body tissues, allowing injured tissues a chance to heal and limiting painful experiences. The following are general guidelines for improved posture.  While reading these guidelines, remember:  The exercises prescribed by your provider will help you have the flexibility and strength to maintain correct postures.  The correct posture provides the best environment for your  joints to work. All of your joints have less wear and tear when properly supported by a spine with good posture. This means you will experience a healthier, less painful body.  Correct posture must be practiced with all of your activities, especially prolonged sitting and standing. Correct posture is as important when doing repetitive low-stress activities (typing) as it is when doing a single heavy-load activity (lifting).  RESTING POSITIONS Consider which positions are most painful for you when choosing a resting position. If you have pain with flexion-based activities (sitting, bending, stooping, squatting), choose a position that allows you to rest in a less flexed posture. You would want to avoid curling into a fetal position on your side. If your pain worsens with extension-based activities (prolonged standing, working overhead), avoid resting in an extended position such as sleeping on your stomach. Most people will find more comfort when they rest with their spine in a more neutral position, neither too rounded nor too arched. Lying on a non-sagging bed on your side with a pillow between your knees, or on your back with a pillow under your knees will often provide some relief. Keep in mind, being in any one position for a prolonged period of time, no matter how correct your posture, can still lead to stiffness.  PROPER SITTING POSTURE In order to minimize stress and discomfort on your spine, you must sit with correct posture. Sitting with good  posture should be effortless for a healthy body. Returning to good posture is a gradual process. Many people can work toward this most comfortably by using various supports until they have the flexibility and strength to maintain this posture on their own. When sitting with proper posture, your ears will fall over your shoulders and your shoulders will fall over your hips. You should use the back of the chair to support your upper back. Your lower back will be in a neutral position, just slightly arched. You may place a small pillow or folded towel at the base of your lower back for  support.  When working at a desk, create an environment that supports good, upright posture. Without extra support, muscles tire, which leads to excessive strain on joints and other tissues. Keep these recommendations in mind:  CHAIR:  A chair should be able to slide under your desk when your back makes contact with the back of the chair. This allows you to work closely.  The chair's height should allow your eyes to be level with the upper part of your monitor and your hands to be slightly lower than your elbows.  BODY POSITION  Your feet should make contact with the floor. If this is not possible, use a foot rest.  Keep your ears over your shoulders. This will reduce stress on your neck and low back.  INCORRECT SITTING POSTURES  If you are feeling tired and unable to assume a healthy sitting posture, do not slouch or slump. This puts excessive strain on your back tissues, causing more damage and pain. Healthier options include:  Using more support, like a lumbar pillow.  Switching tasks to something that requires you to be upright or walking.  Talking a brief walk.  Lying down to rest in a neutral-spine position.  PROLONGED STANDING WHILE SLIGHTLY LEANING FORWARD  When completing a task that requires you to lean forward while standing in one place for a long time, place either foot up on a stationary 2-4  inch high object to help maintain the best posture.  When both feet are on the ground, the lower back tends to lose its slight inward curve. If this curve flattens (or becomes too large), then the back and your other joints will experience too much stress, tire more quickly, and can cause pain.  CORRECT STANDING POSTURES Proper standing posture should be assumed with all daily activities, even if they only take a few moments, like when brushing your teeth. As in sitting, your ears should fall over your shoulders and your shoulders should fall over your hips. You should keep a slight tension in your abdominal muscles to brace your spine. Your tailbone should point down to the ground, not behind your body, resulting in an over-extended swayback posture.   INCORRECT STANDING POSTURES  Common incorrect standing postures include a forward head, locked knees and/or an excessive swayback. WALKING Walk with an upright posture. Your ears, shoulders and hips should all line-up.  PROLONGED ACTIVITY IN A FLEXED POSITION When completing a task that requires you to bend forward at your waist or lean over a low surface, try to find a way to stabilize 3 out of 4 of your limbs. You can place a hand or elbow on your thigh or rest a knee on the surface you are reaching across. This will provide you more stability, so that your muscles do not tire as quickly. By keeping your knees relaxed, or slightly bent, you will also reduce stress across your lower back. CORRECT LIFTING TECHNIQUES  DO :  Assume a wide stance. This will provide you more stability and the opportunity to get as close as possible to the object which you are lifting.  Tense your abdominals to brace your spine. Bend at the knees and hips. Keeping your back locked in a neutral-spine position, lift using your leg muscles. Lift with your legs, keeping your back straight.  Test the weight of unknown objects before attempting to lift them.  Try to keep  your elbows locked down at your sides in order get the best strength from your shoulders when carrying an object.     Always ask for help when lifting heavy or awkward objects. INCORRECT LIFTING TECHNIQUES DO NOT:   Lock your knees when lifting, even if it is a small object.  Bend and twist. Pivot at your feet or move your feet when needing to change directions.  Assume that you can safely pick up even a paperclip without proper posture.

## 2020-07-12 NOTE — Progress Notes (Signed)
Chief Complaint  Patient presents with  . Follow-up    1 month- Blood pressure    Subjective Jamie Sparks is a 65 y.o. male who presents for hypertension follow up. He does monitor home blood pressures. Blood pressures ranging from 140-150's/80-90's on average. He is compliant with medication- valsartan 120 mg/d. Patient has these side effects of medication: balance issues, memory loss; these got worse when he increased valsartan from 80 mg/d to 120 mg/d.  He is adhering to a healthy diet overall. Current exercise: walking No CP or SOB.  Notes he urinates frequently.   LBP for a few days. No inj or change in activity. No neuro s/s's. Denies bruising, redness, swelling. Has not tried anything at home. He tries to stretch routinely. No bowel/bladder incontinence.    Past Medical History:  Diagnosis Date  . Hypertension     Exam BP (!) 152/84 (BP Location: Left Arm, Patient Position: Sitting, Cuff Size: Normal)   Pulse 77   Temp 98 F (36.7 C) (Oral)   Resp 18   Wt 241 lb 6.4 oz (109.5 kg)   SpO2 99%   BMI 32.74 kg/m  General:  well developed, well nourished, in no apparent distress Heart: RRR, no bruits, no LE edema Lungs: clear to auscultation, no accessory muscle use MSK: No ttp over lumbar spine/parasp msc.  Psych: well oriented with normal range of affect and appropriate judgment/insight  Essential hypertension - Plan: doxazosin (CARDURA) 4 MG tablet  Acute bilateral low back pain without sciatica  1. Start above. Monitor BP at home. Send message in 2 weeks. If still elevated, will add spironolactone. Counseled on diet and exercise. He seems to get many AE's from BP meds. If he continues to get, could consider neuropsych eval to evaluate for memory issues further.  2. Stretches/exercises. PT if no better.  F/u in 1 mo. The patient voiced understanding and agreement to the plan.  Jilda Roche [place], DO 07/12/20  9:14 AM

## 2020-08-03 ENCOUNTER — Other Ambulatory Visit: Payer: Self-pay | Admitting: Family Medicine

## 2020-08-03 DIAGNOSIS — I10 Essential (primary) hypertension: Secondary | ICD-10-CM

## 2020-08-13 ENCOUNTER — Encounter: Payer: Self-pay | Admitting: Family Medicine

## 2020-08-13 ENCOUNTER — Other Ambulatory Visit: Payer: Self-pay

## 2020-08-13 ENCOUNTER — Ambulatory Visit (INDEPENDENT_AMBULATORY_CARE_PROVIDER_SITE_OTHER): Payer: Medicare Other | Admitting: Family Medicine

## 2020-08-13 VITALS — BP 138/78 | HR 90 | Temp 98.5°F | Ht 72.0 in | Wt 241.2 lb

## 2020-08-13 DIAGNOSIS — I10 Essential (primary) hypertension: Secondary | ICD-10-CM | POA: Diagnosis not present

## 2020-08-13 DIAGNOSIS — E782 Mixed hyperlipidemia: Secondary | ICD-10-CM

## 2020-08-13 MED ORDER — AMLODIPINE BESYLATE 5 MG PO TABS: 5.0000 mg | ORAL_TABLET | Freq: Every day | ORAL | 11 refills | Status: DC

## 2020-08-13 NOTE — Patient Instructions (Signed)
Give us 2-3 business days to get the results of your labs back.   Keep the diet clean and stay active.  Because your blood pressure is well-controlled, you no longer have to check your blood pressure at home anymore unless you wish. Some people check it twice daily every day and some people stop altogether. Either or anything in between is fine. Strong work!  Let us know if you need anything. 

## 2020-08-13 NOTE — Progress Notes (Signed)
Chief Complaint  Patient presents with  . Follow-up    Subjective Jamie Sparks is a 65 y.o. male who presents for hypertension follow up. He does not monitor home blood pressures. He is compliant with medication- Norvasc 5 mg/d. Patient has these side effects of medication: none He is adhering to a healthy diet overall. Current exercise: walking, cycling No CP or SOB.    Hyperlipidemia Patient presents for dyslipidemia follow up. Currently being treated with nothing. Diet/exercise as above.  The patient is not known to have coexisting coronary artery disease.   Past Medical History:  Diagnosis Date  . Hypertension     Exam BP 138/78 (BP Location: Left Arm, Patient Position: Sitting, Cuff Size: Normal)   Pulse 90   Temp 98.5 F (36.9 C) (Oral)   Ht 6' (1.829 m)   Wt 241 lb 4 oz (109.4 kg)   SpO2 98%   BMI 32.72 kg/m  General:  well developed, well nourished, in no apparent distress Heart: RRR, no bruits, no LE edema Lungs: clear to auscultation, no accessory muscle use Psych: well oriented with normal range of affect and appropriate judgment/insight  Essential hypertension  Mixed hyperlipidemia - Plan: Comprehensive metabolic panel, Lipid panel  1. Cont Norvasc 5 mg/d. Finally controlled. Can stop checking BP at home. Counseled on diet and exercise. 2. Probably will need to restart Crestor. Ck labs today.  F/u in 6 mo or prn. The patient voiced understanding and agreement to the plan.  Jilda Roche [place], DO 08/13/20  9:13 AM

## 2020-08-26 ENCOUNTER — Other Ambulatory Visit: Payer: Self-pay | Admitting: Family Medicine

## 2020-08-26 DIAGNOSIS — I10 Essential (primary) hypertension: Secondary | ICD-10-CM

## 2020-09-02 ENCOUNTER — Telehealth: Payer: Self-pay | Admitting: Family Medicine

## 2020-09-02 DIAGNOSIS — L309 Dermatitis, unspecified: Secondary | ICD-10-CM

## 2020-09-02 MED ORDER — TRIAMCINOLONE ACETONIDE 0.5 % EX CREA: 1.0000 "application " | TOPICAL_CREAM | Freq: Two times a day (BID) | CUTANEOUS | 0 refills | Status: DC

## 2020-09-02 NOTE — Telephone Encounter (Signed)
Medication: triamcinolone cream (KENALOG) 0.5 % [[id number]]  Has the patient contacted their pharmacy? No. (If no, request that the patient contact the pharmacy for the refill.) (If yes, when and what did the pharmacy advise?)  Preferred Pharmacy (with phone number or street name):  [id number] - [place], [place] - [address] Phone:  [phone]  Fax:  [phone]       Agent: Please be advised that RX refills may take up to 3 business days. We ask that you follow-up with your pharmacy.

## 2020-09-10 NOTE — Progress Notes (Signed)
NEUROLOGY FOLLOW UP OFFICE NOTE  Jamie Sparks [id number]  Assessment/Plan:   1.  Balance disorder - however, reports complaints of impaired depth perception.  No diplopia.  Therefore, it is not simply balance.  Does not clearly exhibit signs of specific neurodegenerative disease at this time.  Unclear if this may be vascular related.  He does exhibit Hoffman sign which raises possibility of a cervical myelopathy which can affect balance. 2.  Memory deficits - may be related to cerebrovascular disease but cannot rule out neurodegenerative etiology. 3.  Mild ventriculomegaly - appears to be ex vacuo-related rather than sign of NPH 4.  Chronic microhemorrhages on brain MRI - based on location, likely related to hypertension rather than cerebral amyloid angiopathy 5.  Hypertension  1.  MRI of cervical spine 2.  Neuropsychological evaluation 3.  Follow up after testing. 4.  Follow up with PCP regarding BP  Subjective:  Jamie Sparks is a 65 year old right-handed male with hypertension, hyperlipidemia and history of stroke who follows up for balance disorder.  UPDATE: Repeat MRI of brain with and without contrast on 01/24/2020 personally reviewed showed advanced chronic small vessel ischemic changes with chronic lacunar infarcts in the bilateral corona radiata, bilateral thalami, left pontomedullary junction and right cerebellar peduncle, as well as chronic microhemorrhages most concentrated in the bilateral deep white matter capsules, thalami and pons, as well as ex vacuo ventricular prominence.  Gym walks on treadmill, rides bike and weight program  Went down stairs had to hold down  Problems with depth perception driving - gauging how close car in front of him - no double vision -   HISTORY: He started having balance problems in 2019.  When he kneels down, he can't stand up unless he holds onto something.  Also, he would have trouble maintaining his speed when  walking downhill.  He had trouble riding road bikes.  No dizziness.  No unsteadiness otherwise when walking. Able to maintain balance when standing.  He never feels like he is going to fall.   Denies neck, back or leg pain.  No numbness or weakness in the legs.  He saw a neurologist at that time.  MRI of brain performed on 02/22/2018 reportedly showed extensive chronic small vessel disease with microhemorrhages and multiple chronic infarcts (including cerebellum and basal ganglia) as well as a ring-enhancing lesion in the right periventricular region.  Echocardiogram showed EF 55-60% with no cardiac source of embolus and EKG was unremarkable.  He was seen by neurology.  CTA of head and neck on 03/23/2018 was unremarkable with no evidence of occlusion or significant stenosis.  He was advised to start ASA and Lipitor.  He went to physical therapy which was reportedly helpful.  He goes to the gym daily, using the treadmill 45 minutes, stationary bike 45 minutes and works out with [employer].  Overall, balance has improved.    Labs: 01/28/2018:  TSH 1.84 12/04/2019:  Direct LDL 104 09/08/2019:  HIV and Hep C negative 07/18/2019:  B12 528  PAST MEDICAL HISTORY: Past Medical History:  Diagnosis Date  . Hypertension     MEDICATIONS: Current Outpatient Medications on File Prior to Visit  Medication Sig Dispense Refill  . amLODipine (NORVASC) 5 MG tablet Take 1 tablet (5 mg total) by mouth daily. 30 tablet 11  . co-enzyme Q-10 30 MG capsule Take 30 mg by mouth 3 (three) times daily.    . Multiple Vitamin (MULTIVITAMIN) tablet Take 1 tablet by mouth daily.    Marland Kitchen  rosuvastatin (CRESTOR) 10 MG tablet Take 1 tablet (10 mg total) by mouth daily. 90 tablet 3  . triamcinolone cream (KENALOG) 0.5 % Apply 1 application topically 2 (two) times daily. 30 g 0   No current facility-administered medications on file prior to visit.    ALLERGIES: No Known Allergies  FAMILY HISTORY: Family History  Problem Relation  Age of Onset  . Hypertension Mother   . Hypertension Sister   . Hypertension Sister       Objective:  Blood pressure (!) 195/103, pulse 92, height 6' (1.829 m), weight 240 lb 3.2 oz (109 kg), SpO2 98 %. General: No acute distress.  Patient appears well-groomed.   Head:  Normocephalic/atraumatic Eyes:  Fundi examined but not visualized Neck: supple, no paraspinal tenderness, full range of motion Heart:  Regular rate and rhythm Lungs:  Clear to auscultation bilaterally Back: No paraspinal tenderness Neurological Exam: alert and oriented to person, place, and time. Attention span and concentration intact, recent and remote memory intact, fund of knowledge intact.  Speech fluent and not dysarthric, language intact.  CN II-XII intact. Bulk and tone normal, no rigidity, muscle strength 5/5 throughout.  No tremor or bradykinesia.  Sensation to pinprick and vibration intact.  Deep tendon reflexes 2+ throughout, toes downgoing.  Finger to nose and heel to shin testing intact.  Wide-based gait with short stride, upright position, reduced arm swing, Romberg with sway     [name], DO  CC:  [name], DO

## 2020-09-13 ENCOUNTER — Ambulatory Visit (INDEPENDENT_AMBULATORY_CARE_PROVIDER_SITE_OTHER): Payer: Medicare Other | Admitting: Neurology

## 2020-09-13 ENCOUNTER — Other Ambulatory Visit: Payer: Self-pay

## 2020-09-13 ENCOUNTER — Encounter: Payer: Self-pay | Admitting: Neurology

## 2020-09-13 VITALS — BP 195/103 | HR 92 | Ht 72.0 in | Wt 240.2 lb

## 2020-09-13 DIAGNOSIS — R413 Other amnesia: Secondary | ICD-10-CM

## 2020-09-13 DIAGNOSIS — R292 Abnormal reflex: Secondary | ICD-10-CM | POA: Diagnosis not present

## 2020-09-13 DIAGNOSIS — I679 Cerebrovascular disease, unspecified: Secondary | ICD-10-CM

## 2020-09-13 DIAGNOSIS — R2689 Other abnormalities of gait and mobility: Secondary | ICD-10-CM

## 2020-09-13 NOTE — Patient Instructions (Addendum)
1.  Neuropsychological testing 2.  MRI of cervical spine without contrast .We have sent a referral to [facility] for your MRI and they will call you directly to schedule your appointment. They are located at [address] [facility]. If you need to contact them directly please call [phone].  3.  Follow up afterwards.

## 2020-09-27 ENCOUNTER — Other Ambulatory Visit: Payer: Self-pay | Admitting: Neurology

## 2020-09-27 DIAGNOSIS — I679 Cerebrovascular disease, unspecified: Secondary | ICD-10-CM

## 2020-09-27 DIAGNOSIS — R2689 Other abnormalities of gait and mobility: Secondary | ICD-10-CM

## 2020-09-27 DIAGNOSIS — R292 Abnormal reflex: Secondary | ICD-10-CM

## 2020-10-09 ENCOUNTER — Ambulatory Visit
Admission: RE | Admit: 2020-10-09 | Discharge: 2020-10-09 | Disposition: A | Discharge status: Home / Self Care | Payer: Medicare Other | Source: Ambulatory Visit | Attending: Neurology | Admitting: Neurology

## 2020-10-09 DIAGNOSIS — I679 Cerebrovascular disease, unspecified: Secondary | ICD-10-CM

## 2020-10-09 DIAGNOSIS — R292 Abnormal reflex: Secondary | ICD-10-CM

## 2020-10-09 DIAGNOSIS — R2689 Other abnormalities of gait and mobility: Secondary | ICD-10-CM

## 2020-10-09 MED ORDER — GADOBENATE DIMEGLUMINE 529 MG/ML IV SOLN
20.0000 mL | Freq: Once | INTRAVENOUS | Status: AC | PRN
  Administered 2020-10-09: 20 mL via INTRAVENOUS

## 2020-10-14 ENCOUNTER — Telehealth: Payer: Self-pay | Admitting: Neurology

## 2020-10-14 NOTE — Telephone Encounter (Signed)
Per Dr. Everlena Cooper patient was not seen for pain issues. Patient will need to contact his PCP.

## 2020-10-14 NOTE — Telephone Encounter (Signed)
Would like a call back. Said he missed a call about his results of MRI.

## 2020-10-14 NOTE — Progress Notes (Signed)
Tried  calling pt, Someone answered gave the pt the phone and advised him that it was our office then the phone hung up.

## 2020-10-14 NOTE — Telephone Encounter (Signed)
Pt advised of his MRI results.

## 2020-10-14 NOTE — Telephone Encounter (Signed)
Called patient and informed him that Dr. Everlena Cooper does not see him for pain/ issues and will need to contact his PCP.   Patient verbalized understanding and had no further questions or concerns.

## 2020-10-14 NOTE — Progress Notes (Signed)
Pt advised of his Cervical Spine MRi

## 2020-10-14 NOTE — Telephone Encounter (Signed)
Thayer Ohm would like a call back. He said he would like to see if there was any medication he could take for his pain/issues going on.

## 2020-10-18 ENCOUNTER — Ambulatory Visit (INDEPENDENT_AMBULATORY_CARE_PROVIDER_SITE_OTHER): Payer: Medicare Other | Admitting: Family Medicine

## 2020-10-18 ENCOUNTER — Other Ambulatory Visit: Payer: Self-pay

## 2020-10-18 ENCOUNTER — Encounter: Payer: Self-pay | Admitting: Family Medicine

## 2020-10-18 VITALS — BP 152/98 | HR 93 | Temp 98.4°F | Ht 72.0 in | Wt 240.0 lb

## 2020-10-18 DIAGNOSIS — I10 Essential (primary) hypertension: Secondary | ICD-10-CM

## 2020-10-18 MED ORDER — BENAZEPRIL HCL 10 MG PO TABS: 10.0000 mg | ORAL_TABLET | Freq: Every day | ORAL | 3 refills | Status: DC

## 2020-10-18 NOTE — Progress Notes (Signed)
Chief Complaint  Patient presents with   Follow-up    Blood pressure     Subjective Jamie STARNER is a 65 y.o. male who presents for hypertension follow up. He  has not been  monitoring home blood pressures. He is compliant with medication- Norvasc 5 mg/d. Patient has these side effects of medication: none He is adhering to a healthy diet overall. Current exercise: lifting weights, cycling No Cp or SOB.    Past Medical History:  Diagnosis Date   Hypertension     Exam BP (!) 152/98 (BP Location: Left Arm, Patient Position: Sitting, Cuff Size: Normal)   Pulse 93   Temp 98.4 F (36.9 C) (Oral)   Ht 6' (1.829 m)   Wt 240 lb (108.9 kg)   SpO2 95%   BMI 32.55 kg/m  General:  well developed, well nourished, in no apparent distress Heart: RRR, no bruits, no LE edema Lungs: clear to auscultation, no accessory muscle use Psych: well oriented with normal range of affect and appropriate judgment/insight  Essential hypertension - Plan: benazepril (LOTENSIN) 10 MG tablet  Chronic, uncontrolled.  Add benazepril 10 mg daily.  I do not want to add a diuretic because he has complained of frequent urination in the past.  I do not want add Uroxatrol because he has balance issues.  Could consider increasing this dosage or adding hydralazine versus a beta-blocker.  Monitor blood pressure at home.  Counseled on diet and exercise. F/u in 1 mo. I would like to check his cholesterol at this visit as well. The patient voiced understanding and agreement to the plan.  Jilda Roche [place], DO 10/18/20  4:23 PM

## 2020-10-18 NOTE — Patient Instructions (Signed)
Stay on the amlodipine.   Keep the diet clean and stay active.  Check your blood pressures 2-3 times per week, alternating the time of day you check it. If it is high, considering waiting 1-2 minutes and rechecking. If it gets higher, your anxiety is likely creeping up and we should avoid rechecking.   Come fasting to your next appointment.   Let us know if you need anything.

## 2020-11-15 ENCOUNTER — Ambulatory Visit (INDEPENDENT_AMBULATORY_CARE_PROVIDER_SITE_OTHER): Payer: Medicare Other | Admitting: Family Medicine

## 2020-11-15 ENCOUNTER — Encounter: Payer: Self-pay | Admitting: Family Medicine

## 2020-11-15 ENCOUNTER — Other Ambulatory Visit: Payer: Self-pay

## 2020-11-15 ENCOUNTER — Other Ambulatory Visit: Payer: Self-pay | Admitting: Family Medicine

## 2020-11-15 VITALS — BP 138/80 | HR 82 | Temp 98.2°F | Ht 72.0 in | Wt 241.5 lb

## 2020-11-15 DIAGNOSIS — E782 Mixed hyperlipidemia: Secondary | ICD-10-CM | POA: Diagnosis not present

## 2020-11-15 DIAGNOSIS — I10 Essential (primary) hypertension: Secondary | ICD-10-CM | POA: Diagnosis not present

## 2020-11-15 LAB — COMPREHENSIVE METABOLIC PANEL
ALT: 10 U/L (ref 0–53)
AST: 15 U/L (ref 0–37)
Albumin: 4.4 g/dL (ref 3.5–5.2)
Alkaline Phosphatase: 73 U/L (ref 39–117)
BUN: 14 mg/dL (ref 6–23)
CO2: 27 mEq/L (ref 19–32)
Calcium: 9.4 mg/dL (ref 8.4–10.5)
Chloride: 104 mEq/L (ref 96–112)
Creatinine, Ser: 1.09 mg/dL (ref 0.40–1.50)
GFR: 70.78 mL/min (ref >60.00)
Glucose, Bld: 100 mg/dL — ABNORMAL HIGH (ref 70–99)
Potassium: 4.4 mEq/L (ref 3.5–5.1)
Sodium: 138 mEq/L (ref 135–145)
Total Bilirubin: 0.6 mg/dL (ref 0.2–1.2)
Total Protein: 6.9 g/dL (ref 6.0–8.3)

## 2020-11-15 LAB — LIPID PANEL
Cholesterol: 216 mg/dL — ABNORMAL HIGH (ref 0–200)
HDL: 37.6 mg/dL — ABNORMAL LOW (ref >39.00)
LDL Cholesterol: 146 mg/dL — ABNORMAL HIGH (ref 0–99)
NonHDL: 178.34
Total CHOL/HDL Ratio: 6
Triglycerides: 162 mg/dL — ABNORMAL HIGH (ref 0.0–149.0)
VLDL: 32.4 mg/dL (ref 0.0–40.0)

## 2020-11-15 MED ORDER — ROSUVASTATIN CALCIUM 20 MG PO TABS: 20.0000 mg | ORAL_TABLET | Freq: Every day | ORAL | 3 refills | Status: DC

## 2020-11-15 NOTE — Patient Instructions (Addendum)
Please follow up with Dr. Everlena Cooper regarding your balance issues.  Give [place] 2-3 business days to get the results of your labs back.   Keep the diet clean and stay active.  The new Shingrix vaccine (for shingles) is a 2 shot series. It can make people feel low energy, achy and almost like they have the flu for 48 hours after injection. Please plan accordingly when deciding on when to get this shot. Call your pharmacy for an appointment to get this. The second shot of the series is less severe regarding the side effects, but it still lasts 48 hours.   Let us know if you need anything.

## 2020-11-15 NOTE — Progress Notes (Signed)
Chief Complaint  Patient presents with   Follow-up    Subjective Jamie Sparks is a 65 y.o. male who presents for hypertension follow up. He does monitor home blood pressures. He is compliant with medications- benazepril 10 mg/d, Norvasc 5 mg/d. Patient has these side effects of medication: none He is adhering to a healthy diet overall. Current exercise: walking, cycling No CP or SOB.   Hyperlipidemia Patient presents for dyslipidemia follow up. Currently being treated with Crestor 10 mg/d and compliance with treatment thus far has been good. He denies myalgias. Diet/exercise as above. The patient is not known to have coexisting coronary artery disease.   Past Medical History:  Diagnosis Date   Hypertension     Exam BP 138/80   Pulse 82   Temp 98.2 F (36.8 C) (Oral)   Ht 6' (1.829 m)   Wt 241 lb 8 oz (109.5 kg)   SpO2 98%   BMI 32.75 kg/m  General:  well developed, well nourished, in no apparent distress Heart: RRR, no bruits, no LE edema Lungs: clear to auscultation, no accessory muscle use Psych: well oriented with normal range of affect and appropriate judgment/insight  Essential hypertension  Mixed hyperlipidemia - Plan: Comprehensive metabolic panel, Lipid panel  Chronic, stable. Cont Norvasc 5 mg/d, Lotensin 10 mg/d. Counseled on diet and exercise. Chronic, unsure if stable. Ck labs today. Cont Crestor 10 mg/d Shingrix rec'd.  F/u in 6 mo pending above. The patient voiced understanding and agreement to the plan.  Jilda Roche [place], DO 11/15/20  8:02 AM

## 2020-11-16 ENCOUNTER — Other Ambulatory Visit: Payer: Self-pay | Admitting: *Deleted

## 2020-11-16 DIAGNOSIS — E782 Mixed hyperlipidemia: Secondary | ICD-10-CM

## 2020-12-02 ENCOUNTER — Encounter: Payer: Medicare Other | Admitting: Psychology

## 2020-12-09 ENCOUNTER — Encounter: Payer: Medicare Other | Admitting: Psychology

## 2021-01-01 ENCOUNTER — Other Ambulatory Visit: Payer: Self-pay | Admitting: Family Medicine

## 2021-01-01 DIAGNOSIS — L309 Dermatitis, unspecified: Secondary | ICD-10-CM

## 2021-01-01 DIAGNOSIS — I10 Essential (primary) hypertension: Secondary | ICD-10-CM

## 2021-01-05 ENCOUNTER — Other Ambulatory Visit (INDEPENDENT_AMBULATORY_CARE_PROVIDER_SITE_OTHER): Payer: Medicare Other

## 2021-01-05 ENCOUNTER — Other Ambulatory Visit: Payer: Self-pay

## 2021-01-05 DIAGNOSIS — E782 Mixed hyperlipidemia: Secondary | ICD-10-CM

## 2021-01-05 LAB — HEPATIC FUNCTION PANEL
ALT: 12 U/L (ref 0–53)
AST: 17 U/L (ref 0–37)
Albumin: 4.2 g/dL (ref 3.5–5.2)
Alkaline Phosphatase: 74 U/L (ref 39–117)
Bilirubin, Direct: 0.1 mg/dL (ref 0.0–0.3)
Total Bilirubin: 0.6 mg/dL (ref 0.2–1.2)
Total Protein: 6.9 g/dL (ref 6.0–8.3)

## 2021-01-05 LAB — LIPID PANEL
Cholesterol: 209 mg/dL — ABNORMAL HIGH (ref 0–200)
HDL: 36.9 mg/dL — ABNORMAL LOW (ref >39.00)
LDL Cholesterol: 141 mg/dL — ABNORMAL HIGH (ref 0–99)
NonHDL: 171.97
Total CHOL/HDL Ratio: 6
Triglycerides: 155 mg/dL — ABNORMAL HIGH (ref 0.0–149.0)
VLDL: 31 mg/dL (ref 0.0–40.0)

## 2021-01-07 ENCOUNTER — Telehealth: Payer: Self-pay | Admitting: Family Medicine

## 2021-01-07 MED ORDER — ATORVASTATIN CALCIUM 40 MG PO TABS
40.0000 mg | ORAL_TABLET | Freq: Every day | ORAL | 3 refills | Status: DC
Start: 2021-01-07 — End: 2021-01-12

## 2021-01-07 NOTE — Telephone Encounter (Signed)
Equivalent dose of Lipitor sent.

## 2021-01-07 NOTE — Telephone Encounter (Signed)
Has developed a cough from the Crestor and would like to change medications.

## 2021-01-07 NOTE — Telephone Encounter (Signed)
Patient informed prescription sent 

## 2021-01-12 ENCOUNTER — Telehealth: Payer: Self-pay

## 2021-01-12 ENCOUNTER — Other Ambulatory Visit: Payer: Self-pay | Admitting: Family Medicine

## 2021-01-12 MED ORDER — ROSUVASTATIN CALCIUM 10 MG PO TABS: 10.0000 mg | ORAL_TABLET | Freq: Every day | ORAL | 3 refills | Status: DC

## 2021-01-12 NOTE — Telephone Encounter (Signed)
Patient's wife informed

## 2021-01-12 NOTE — Telephone Encounter (Signed)
Pt's wife called stating she was concerned about the patient being Rx statins. She advised that the patient has not been taking any of the statins that have been prescribed. She states he has started taking Rosuvastatin 10 MG for almost week now. She states he would complain that the other statins made him feel bad, with cough, and body aches. She would like the Rosuvastatin 10 MG refilled and a call back at [phone]

## 2021-02-14 ENCOUNTER — Ambulatory Visit: Payer: Medicare Other | Admitting: Family Medicine

## 2021-02-24 NOTE — Progress Notes (Signed)
Virtual Visit via Video Note The purpose of this virtual visit is to provide medical care while limiting exposure to the novel coronavirus.    Consent was obtained for video visit:  Yes.   Answered questions that patient had about telehealth interaction:  Yes.   I discussed the limitations, risks, security and privacy concerns of performing an evaluation and management service by telemedicine. I also discussed with the patient that there may be a patient responsible charge related to this service. The patient expressed understanding and agreed to proceed.  Pt location: Home Physician Location: office Name of referring provider:  Sharlene Dory* I connected with Jamie Sparks at patients initiation/request on 02/25/2021 at  9:50 AM EDT by video enabled telemedicine application and verified that I am speaking with the correct person using two identifiers. Pt MRN:  [id number] Pt DOB:  [birth year] Video Participants:  Jamie Sparks;  Assessment and Plan:   1.  Balance disorder- likely cerebrovascular-related.  No evidence of cervical myelopathy.  Mild ventriculomegaly appears to be ex vacuo-related rather than NPH.   2.  Chronic microhemorrhages on brain MRI - based on location, likely related to hypertension rather than cerebral amyloid angiopathy   At this time, no further recommendations other than to manage blood pressure and other stroke risk factors and to continue exercising.  History of Present Illness:  Jamie Sparks is a 65 year old right-handed male with hypertension, hyperlipidemia and history of stroke who follows up for balance disorder.   UPDATE: MRI of cervical spine with and without contrast on 10/09/2020 personally reviewed showed some degenerative disc disease at C4-5 with flattening of the ventral thecal sac but no evidence of cord compression or syrinx.    Continues exercising and going to the gym, which has helped.     HISTORY: He started having balance problems in 2019.  When he kneels down, he can't stand up unless he holds onto something.  Also, he would have trouble maintaining his speed when walking downhill.  He had trouble riding road bikes.  No dizziness.  No unsteadiness otherwise when walking. Able to maintain balance when standing.  He never feels like he is going to fall.   Denies neck, back or leg pain.  No numbness or weakness in the legs.  He saw a neurologist at that time.  MRI of brain performed on 02/22/2018 reportedly showed extensive chronic small vessel disease with microhemorrhages and multiple chronic infarcts (including cerebellum and basal ganglia) as well as a ring-enhancing lesion in the right periventricular region.  Echocardiogram showed EF 55-60% with no cardiac source of embolus and EKG was unremarkable.  He was seen by neurology.  CTA of head and neck on 03/23/2018 was unremarkable with no evidence of occlusion or significant stenosis.  He was advised to start ASA and Lipitor.  Repeat MRI of brain with and without contrast on 01/24/2020 showed advanced chronic small vessel ischemic changes with chronic lacunar infarcts in the bilateral corona radiata, bilateral thalami, left pontomedullary junction and right cerebellar peduncle, as well as chronic microhemorrhages most concentrated in the bilateral deep white matter capsules, thalami and pons,as well as ex vacuo ventricular prominence.  He went to physical therapy which was reportedly helpful.     Labs: 01/28/2018:  TSH 1.84 12/04/2019:  Direct LDL 104 09/08/2019:  HIV and Hep C negative 07/18/2019:  B12 528  Past Medical History: Past Medical History:  Diagnosis Date   Hypertension     Medications: Outpatient  Encounter Medications as of 02/25/2021  Medication Sig   amLODipine (NORVASC) 5 MG tablet Take 1 tablet (5 mg total) by mouth daily.   benazepril (LOTENSIN) 10 MG tablet TAKE 1 TABLET BY MOUTH EVERY DAY   co-enzyme  Q-10 30 MG capsule Take 30 mg by mouth 3 (three) times daily.   Multiple Vitamin (MULTIVITAMIN) tablet Take 1 tablet by mouth daily.   rosuvastatin (CRESTOR) 10 MG tablet Take 1 tablet (10 mg total) by mouth daily.   triamcinolone cream (KENALOG) 0.5 % APPLY TO AFFECTED AREA TWICE A DAY   No facility-administered encounter medications on file as of 02/25/2021.    Allergies: No Known Allergies  Family History: Family History  Problem Relation Age of Onset   Hypertension Mother    Hypertension Sister    Hypertension Sister     Observations/Objective:   Height 5\' 6"  (1.676 m), weight 240 lb (108.9 kg). No acute distress.  Alert and oriented.  Speech fluent and not dysarthric.  Language intact.     Follow Up Instructions:    -I discussed the assessment and treatment plan with the patient. The patient was provided an opportunity to ask questions and all were answered. The patient agreed with the plan and demonstrated an understanding of the instructions.   The patient was advised to call back or seek an in-person evaluation if the symptoms worsen or if the condition fails to improve as anticipated.   , DO

## 2021-02-25 ENCOUNTER — Encounter: Payer: Self-pay | Admitting: Neurology

## 2021-02-25 ENCOUNTER — Telehealth (INDEPENDENT_AMBULATORY_CARE_PROVIDER_SITE_OTHER): Payer: Medicare Other | Admitting: Neurology

## 2021-02-25 ENCOUNTER — Other Ambulatory Visit: Payer: Self-pay

## 2021-02-25 VITALS — Ht 66.0 in | Wt 240.0 lb

## 2021-02-25 DIAGNOSIS — G935 Compression of brain: Secondary | ICD-10-CM | POA: Insufficient documentation

## 2021-02-25 DIAGNOSIS — I679 Cerebrovascular disease, unspecified: Secondary | ICD-10-CM

## 2021-02-25 DIAGNOSIS — R2689 Other abnormalities of gait and mobility: Secondary | ICD-10-CM

## 2021-02-28 ENCOUNTER — Ambulatory Visit (INDEPENDENT_AMBULATORY_CARE_PROVIDER_SITE_OTHER): Payer: Medicare Other | Admitting: Family Medicine

## 2021-02-28 ENCOUNTER — Encounter: Payer: Self-pay | Admitting: Family Medicine

## 2021-02-28 ENCOUNTER — Other Ambulatory Visit: Payer: Self-pay

## 2021-02-28 VITALS — BP 144/82 | HR 91 | Temp 98.2°F | Ht 72.0 in | Wt 239.2 lb

## 2021-02-28 DIAGNOSIS — R7303 Prediabetes: Secondary | ICD-10-CM | POA: Diagnosis not present

## 2021-02-28 DIAGNOSIS — R3589 Other polyuria: Secondary | ICD-10-CM

## 2021-02-28 NOTE — Progress Notes (Deleted)
Acute Office Visit  Subjective:    Patient ID: Jamie Sparks, male    DOB: November 09, 1955, 65 y.o.   MRN: [id number]  No chief complaint on file.   HPI Patient is in today for ***  Past Medical History:  Diagnosis Date   Hypertension     No past surgical history on file.  Family History  Problem Relation Age of Onset   Hypertension Mother    Hypertension Sister    Hypertension Sister     Social History   Socioeconomic History   Marital status: Married    Spouse name: Not on file   Number of children: Not on file   Years of education: Not on file   Highest education level: Not on file  Occupational History   Not on file  Tobacco Use   Smoking status: Never   Smokeless tobacco: Never  Substance and Sexual Activity   Alcohol use: Yes   Drug use: No   Sexual activity: Not on file  Other Topics Concern   Not on file  Social History Narrative   Right handed   One story home   Drinks caffeine   Social Determinants of Health   Financial Resource Strain: Not on file  Food Insecurity: Not on file  Transportation Needs: Not on file  Physical Activity: Not on file  Stress: Not on file  Social Connections: Not on file  Intimate Partner Violence: Not on file    Outpatient Medications Prior to Visit  Medication Sig Dispense Refill   amLODipine (NORVASC) 5 MG tablet Take 1 tablet (5 mg total) by mouth daily. 30 tablet 11   benazepril (LOTENSIN) 10 MG tablet TAKE 1 TABLET BY MOUTH EVERY DAY 90 tablet 1   co-enzyme Q-10 30 MG capsule Take 30 mg by mouth 3 (three) times daily.     Multiple Vitamin (MULTIVITAMIN) tablet Take 1 tablet by mouth daily.     rosuvastatin (CRESTOR) 10 MG tablet Take 1 tablet (10 mg total) by mouth daily. 90 tablet 3   triamcinolone cream (KENALOG) 0.5 % APPLY TO AFFECTED AREA TWICE A DAY 30 g 0   No facility-administered medications prior to visit.    No Known Allergies  Review of Systems     Objective:    Physical  Exam  There were no vitals taken for this visit. Wt Readings from Last 3 Encounters:  02/25/21 240 lb (108.9 kg)  11/15/20 241 lb 8 oz (109.5 kg)  10/18/20 240 lb (108.9 kg)    Health Maintenance Due  Topic Date Due   Zoster Vaccines- Shingrix (1 of 2) Never done   COVID-19 Vaccine (3 - Pfizer risk series) 02/16/2020   Pneumonia Vaccine 74+ Years old (2 - PCV) 09/07/2020   INFLUENZA VACCINE  12/06/2020    There are no preventive care reminders to display for this patient.   Lab Results  Component Value Date   TSH 1.22 11/04/2010   Lab Results  Component Value Date   WBC 8.0 07/18/2019   HGB 14.3 07/18/2019   HCT 42.9 07/18/2019   MCV 87.9 07/18/2019   PLT 280 07/18/2019   Lab Results  Component Value Date   NA 138 11/15/2020   K 4.4 11/15/2020   CO2 27 11/15/2020   GLUCOSE 100 (H) 11/15/2020   BUN 14 11/15/2020   CREATININE 1.09 11/15/2020   BILITOT 0.6 01/05/2021   ALKPHOS 74 01/05/2021   AST 17 01/05/2021   ALT 12 01/05/2021   PROT  6.9 01/05/2021   ALBUMIN 4.2 01/05/2021   CALCIUM 9.4 11/15/2020   GFR 70.78 11/15/2020   Lab Results  Component Value Date   CHOL 209 (H) 01/05/2021   Lab Results  Component Value Date   HDL 36.90 (L) 01/05/2021   Lab Results  Component Value Date   LDLCALC 141 (H) 01/05/2021   Lab Results  Component Value Date   TRIG 155.0 (H) 01/05/2021   Lab Results  Component Value Date   CHOLHDL 6 01/05/2021   No results found for: HGBA1C     Assessment & Plan:   Problem List Items Addressed This Visit   None    No orders of the defined types were placed in this encounter.    Jaidence Geisler, Vladimir Crofts, CMA

## 2021-02-28 NOTE — Patient Instructions (Signed)
Give [place] 2-3 business days to get the results of your labs back.   If labs are normal, we will send in a new medication to treat overactive bladder.  Mind the fluid intake.   Let us know if you need anything.

## 2021-02-28 NOTE — Progress Notes (Signed)
Chief Complaint  Patient presents with   Follow-up    Blood pressure     Subjective: Patient is a 65 y.o. male here for urinary freq.  This is been going on for a couple years now, worsening over the past week.  Urinates every hr while awake, urinates a normal amount. Does not urinate much during the night, maybe once. Drinks 1 cup of coffee in the AM, does not drink alcohol. No pain, bleeding, discharge, constipation, wt loss.  Of note, he went through a cycle of different blood pressure medicines and continued to have this issue.  Past Medical History:  Diagnosis Date   Hypertension     Objective: BP (!) 144/82   Pulse 91   Temp 98.2 F (36.8 C) (Oral)   Ht 6' (1.829 m)   Wt 239 lb 4 oz (108.5 kg)   SpO2 99%   BMI 32.45 kg/m  General: Awake, appears stated age Abd: BS+, S, NT, ND Heart: RRR, no murmurs Lungs: CTAB, no rales, wheezes or rhonchi. No accessory muscle use Psych: Age appropriate judgment and insight, normal affect and mood  Assessment and Plan: Polyuria  Prediabetes - Plan: Hemoglobin A1c  New problem, uncertain prognosis.  Rule out diabetes.  If that is normal, will order Ditropan XL 5 mg daily.  Unlikely to be prostate given good voiding volume and lack of nocturnal symptoms.  I will follow-up with him in 1 month to recheck. The patient voiced understanding and agreement to the plan.  Jilda Roche [place], DO 02/28/21  2:54 PM

## 2021-03-01 ENCOUNTER — Other Ambulatory Visit: Payer: Self-pay | Admitting: Family Medicine

## 2021-03-01 LAB — HEMOGLOBIN A1C: Hgb A1c MFr Bld: 5.9 % (ref 4.6–6.5)

## 2021-03-01 MED ORDER — OXYBUTYNIN CHLORIDE ER 5 MG PO TB24: 5.0000 mg | ORAL_TABLET | Freq: Every day | ORAL | 2 refills | Status: DC

## 2021-03-02 IMAGING — MR MR HEAD WO/W CM
10 series · 48 of 48 positions shown · IV contrast (gadavist)
Comparison: None.

CLINICAL DATA: 64-year-old male with disorder of balance and gait,
progressed over the past 6 months and now severe.

EXAM:
MRI HEAD WITHOUT AND WITH CONTRAST
TECHNIQUE: Multiplanar, multiecho pulse sequences of the brain and surrounding
structures were obtained without and with intravenous contrast.
CONTRAST:  10mL GADAVIST GADOBUTROL 1 MMOL/ML IV SOLN

[Series 2: T1 · sagittal · 5.0mm · 0.90mm/px · 1 of 27 slices shown (1 of 2)]
[im 1/27]
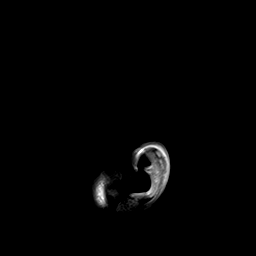

[Series 3: DWI · axial · 3.0mm · 1.88mm/px · z∈[-55,+92]mm · 9 of 96 slices shown (1 of 2)]
[im 1/96]
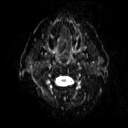
[im 12/96]
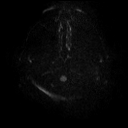
[im 24/96]
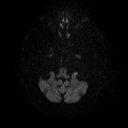
[im 36/96]
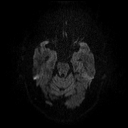
[im 48/96]
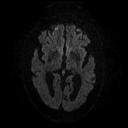
[im 60/96]
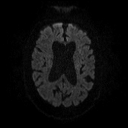
[im 72/96]
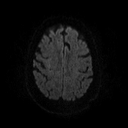
[im 84/96]
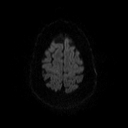
[im 96/96]
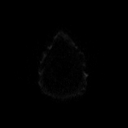

[Series 4: DWI · axial · 3.0mm · 1.88mm/px · z∈[-55,+92]mm · 4 of 48 slices shown (2 of 2)]
[im 1/48]
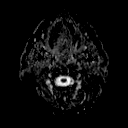
[im 16/48]
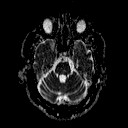
[im 32/48]
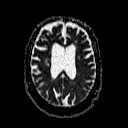
[im 48/48]
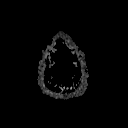

[Series 5: T2 · axial · 5.0mm · 0.69mm/px · z∈[-62,+91]mm · 3 of 28 slices shown (1 of 3)]
[im 1/28]
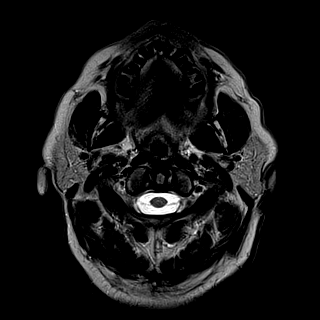
[im 14/28]
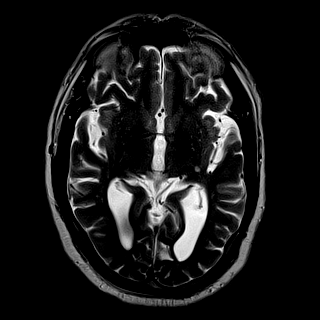
[im 28/28]
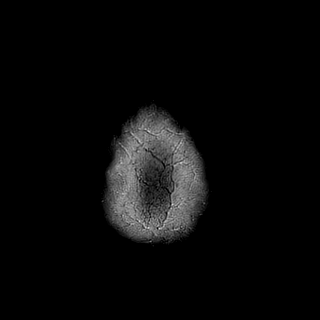

[Series 6: T2 · axial · 5.0mm · 0.43mm/px · z∈[-62,+91]mm · 3 of 28 slices shown (2 of 3)]
[im 1/28]
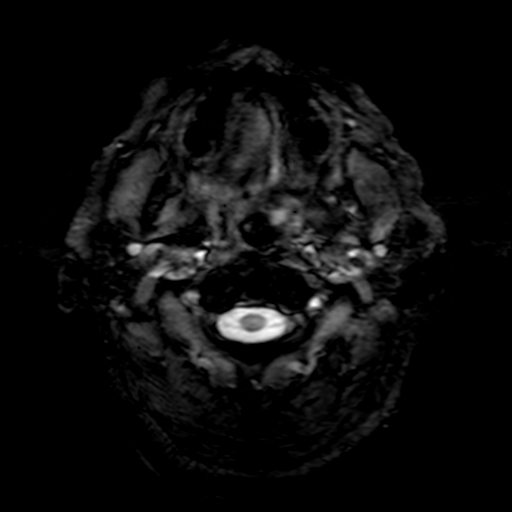
[im 14/28]
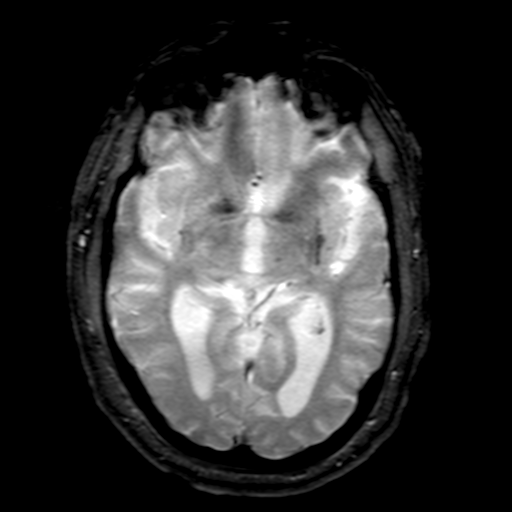
[im 28/28]
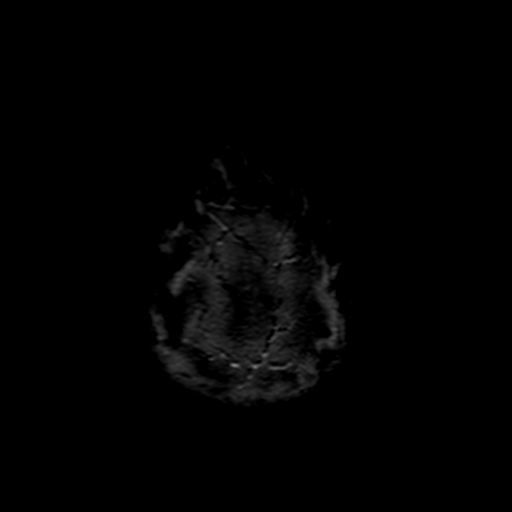

[Series 7: FLAIR · axial · 3.0mm · 0.43mm/px · z∈[-62,+92]mm · 4 of 42 slices shown]
[im 1/42]
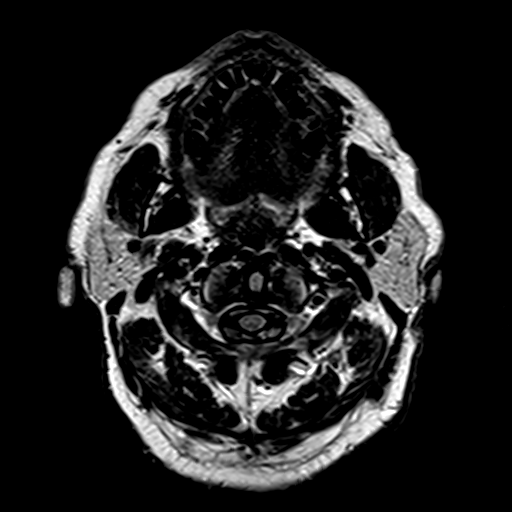
[im 14/42]
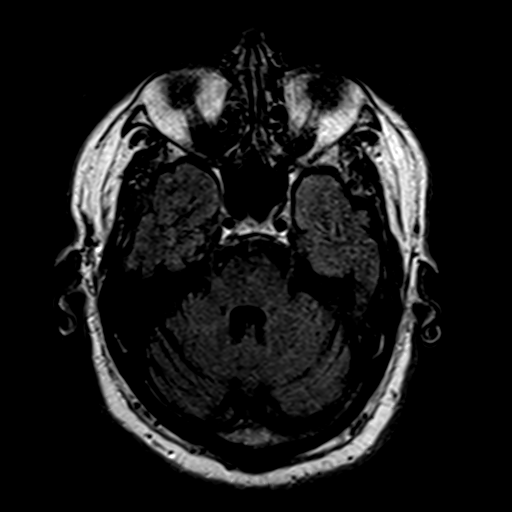
[im 28/42]
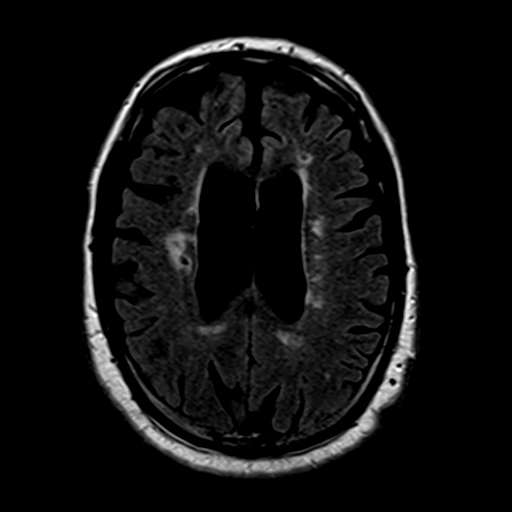
[im 42/42]
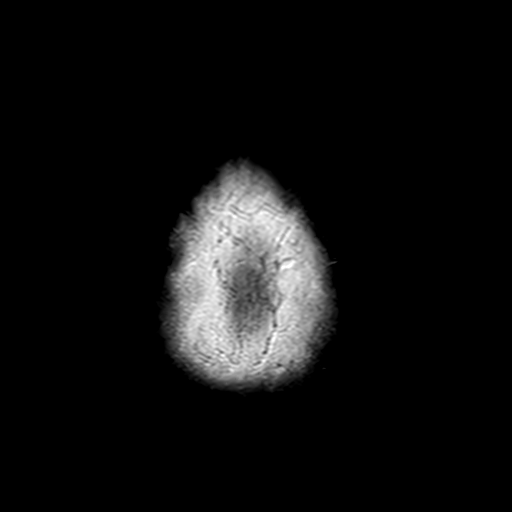

[Series 8: t1_3d_tra · axial · 2.0mm · 0.94mm/px · z∈[-63,+116]mm · 9 of 96 slices shown]
[im 1/96]
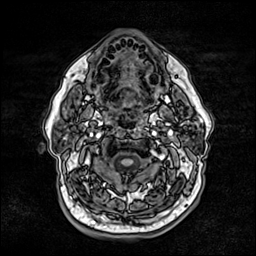
[im 12/96]
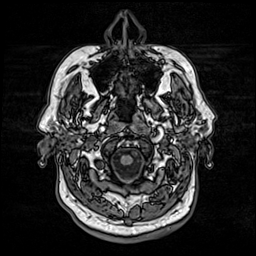
[im 24/96]
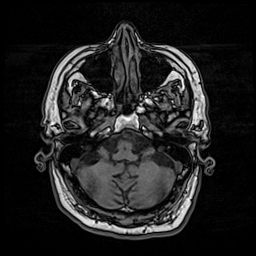
[im 36/96]
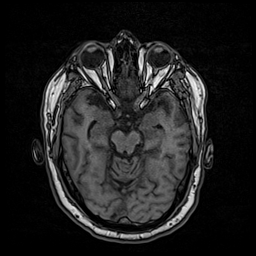
[im 48/96]
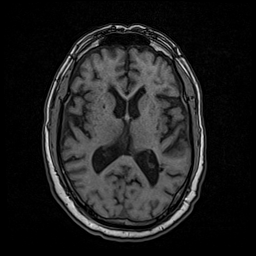
[im 60/96]
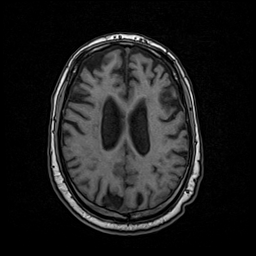
[im 72/96]
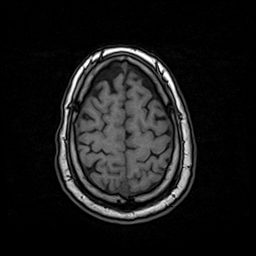
[im 84/96]
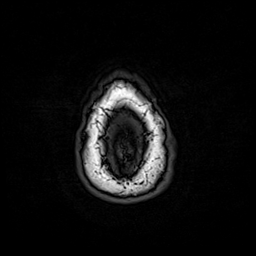
[im 96/96]
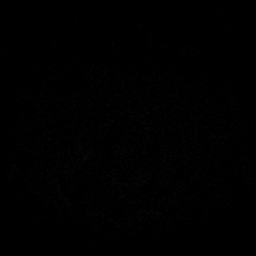

[Series 9: T2 · coronal · 5.0mm · 0.69mm/px · 3 of 30 slices shown (3 of 3)]
[im 1/30]
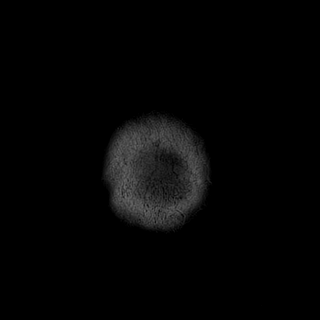
[im 15/30]
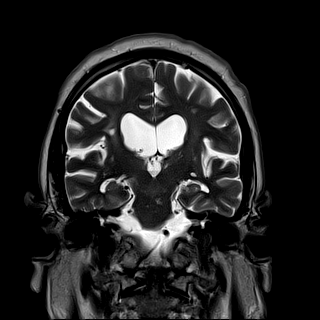
[im 30/30]
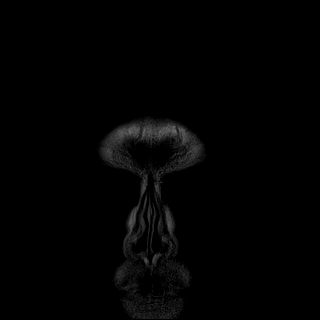

[Series 10: t1_3d_tra +c · axial · 2.0mm · 0.94mm/px · z∈[-63,+116]mm · 9 of 96 slices shown]
[im 1/96]
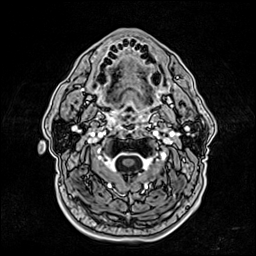
[im 12/96]
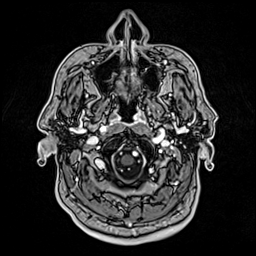
[im 24/96]
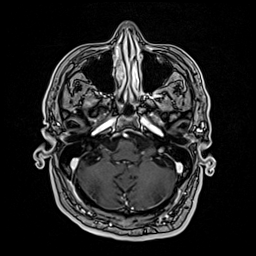
[im 36/96]
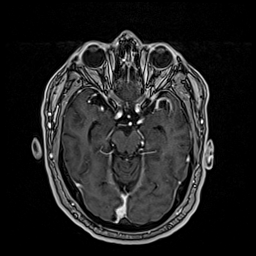
[im 48/96]
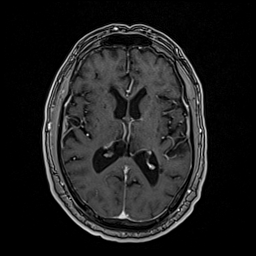
[im 60/96]
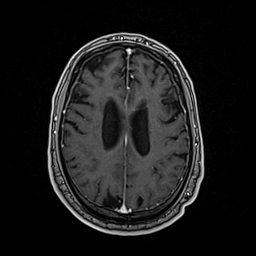
[im 72/96]
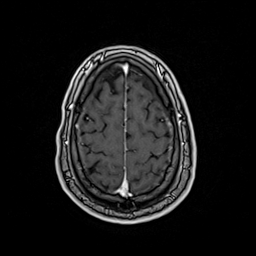
[im 84/96]
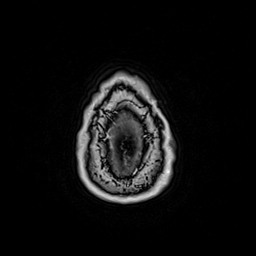
[im 96/96]
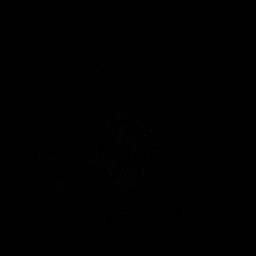

[Series 11: T1 · coronal · 5.0mm · 0.86mm/px · 3 of 30 slices shown (2 of 2)]
[im 1/30]
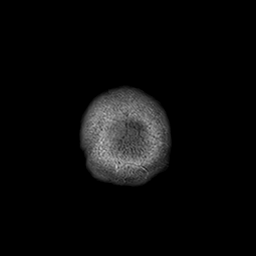
[im 15/30]
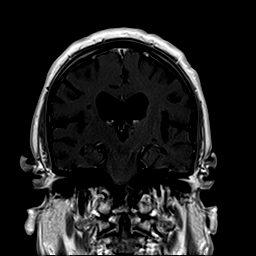
[im 30/30]
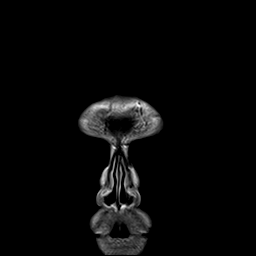

[48 of 48 positions shown; findings below may reference images not displayed]

FINDINGS: Brain: No restricted diffusion or evidence of acute infarction.
Signal abnormality most compatible with chronic lacunar infarcts
scattered in the bilateral corona radiata, in the bilateral thalami,
at the left pontomedullary junction, and in the right cerebellar
peduncle (series 5, image 9). No cortical encephalomalacia
identified, but there are fairly numerous chronic microhemorrhages
scattered in the brain, most concentrated in the bilateral deep
white matter capsules, thalami, and pons (series 6). Still, the
extent is less than that typical for amyloid angiopathy.

Ventricular prominence which seems to be ex vacuo related, the
temporal horns remain diminutive.

No midline shift, mass effect, evidence of mass lesion, extra-axial
collection or acute intracranial hemorrhage. Cervicomedullary
junction and pituitary are within normal limits. No abnormal
enhancement identified. No dural thickening.

Vascular: Major intracranial vascular flow voids are preserved. The
left vertebral artery appears dominant. The major dural venous
sinuses are enhancing and appear to be patent.

Skull and upper cervical spine: Negative visible cervical spine,
bone marrow signal.

Sinuses/Orbits: Negative.

Other: Trace mastoid air cell fluid bilaterally. Negative
nasopharynx. Visible internal auditory structures appear normal;
tortuous right AICA appears to involve the right internal auditory
canal, which is typically a normal variant. Normal stylomastoid
foramina. Scalp and face appear negative.
IMPRESSION: 1. Signal changes most compatible with advanced chronic small vessel
disease, including multiple chronic lacunar infarcts and fairly
numerous chronic microhemorrhages which are most concentrated in the
deep white matter capsules, thalami, and pons.
2. Ventricular prominence felt to be ex vacuo related.
3. No acute intracranial abnormality.

## 2021-03-21 ENCOUNTER — Ambulatory Visit (INDEPENDENT_AMBULATORY_CARE_PROVIDER_SITE_OTHER): Payer: Medicare Other

## 2021-03-21 VITALS — Ht 72.0 in | Wt 239.0 lb

## 2021-03-21 DIAGNOSIS — Z0000 Encounter for general adult medical examination without abnormal findings: Secondary | ICD-10-CM

## 2021-03-21 NOTE — Patient Instructions (Signed)
Jamie Sparks , Thank you for taking time to complete your Medicare Wellness Visit. I appreciate your ongoing commitment to your health goals. Please review the following plan we discussed and let me know if I can assist you in the future.   Screening recommendations/referrals: Colonoscopy: Cologuard completed 09/18/2019-Due 09/18/2022 Recommended yearly ophthalmology/optometry visit for glaucoma screening and checkup Recommended yearly dental visit for hygiene and checkup  Vaccinations: Influenza vaccine: Declined Pneumococcal vaccine: Due-May obtain vaccine at our office or your local pharmacy. Tdap vaccine: Discuss with pharmacy Shingles vaccine: Declined   Covid-19: Booster available at the pharmacy  Advanced directives: Declined information today.  Conditions/risks identified: See problem list  Next appointment: Follow up in one year for your annual wellness visit.   Preventive Care 66 Years and Older, Male Preventive care refers to lifestyle choices and visits with your health care provider that can promote health and wellness. What does preventive care include? A yearly physical exam. This is also called an annual well check. Dental exams once or twice a year. Routine eye exams. Ask your health care provider how often you should have your eyes checked. Personal lifestyle choices, including: Daily care of your teeth and gums. Regular physical activity. Eating a healthy diet. Avoiding tobacco and drug use. Limiting alcohol use. Practicing safe sex. Taking low doses of aspirin every day. Taking vitamin and mineral supplements as recommended by your health care provider. What happens during an annual well check? The services and screenings done by your health care provider during your annual well check will depend on your age, overall health, lifestyle risk factors, and family history of disease. Counseling  Your health care provider may ask you questions about your: Alcohol  use. Tobacco use. Drug use. Emotional well-being. Home and relationship well-being. Sexual activity. Eating habits. History of falls. Memory and ability to understand (cognition). Work and work Astronomer. Screening  You may have the following tests or measurements: Height, weight, and BMI. Blood pressure. Lipid and cholesterol levels. These may be checked every 5 years, or more frequently if you are over 22 years old. Skin check. Lung cancer screening. You may have this screening every year starting at age 66 if you have a 30-pack-year history of smoking and currently smoke or have quit within the past 15 years. Fecal occult blood test (FOBT) of the stool. You may have this test every year starting at age 66. Flexible sigmoidoscopy or colonoscopy. You may have a sigmoidoscopy every 5 years or a colonoscopy every 10 years starting at age 66. Prostate cancer screening. Recommendations will vary depending on your family history and other risks. Hepatitis C blood test. Hepatitis B blood test. Sexually transmitted disease (STD) testing. Diabetes screening. This is done by checking your blood sugar (glucose) after you have not eaten for a while (fasting). You may have this done every 1-3 years. Abdominal aortic aneurysm (AAA) screening. You may need this if you are a current or former smoker. Osteoporosis. You may be screened starting at age 66 if you are at high risk. Talk with your health care provider about your test results, treatment options, and if necessary, the need for more tests. Vaccines  Your health care provider may recommend certain vaccines, such as: Influenza vaccine. This is recommended every year. Tetanus, diphtheria, and acellular pertussis (Tdap, Td) vaccine. You may need a Td booster every 10 years. Zoster vaccine. You may need this after age 66. Pneumococcal 13-valent conjugate (PCV13) vaccine. One dose is recommended after age 66. Pneumococcal polysaccharide  (  PPSV23) vaccine. One dose is recommended after age 24. Talk to your health care provider about which screenings and vaccines you need and how often you need them. This information is not intended to replace advice given to you by your health care provider. Make sure you discuss any questions you have with your health care provider. Document Released: 05/21/2015 Document Revised: 01/12/2016 Document Reviewed: 02/23/2015 Elsevier Interactive Patient Education  2017 [employer] Prevention in the Home Falls can cause injuries. They can happen to people of all ages. There are many things you can do to make your home safe and to help prevent falls. What can I do on the outside of my home? Regularly fix the edges of walkways and driveways and fix any cracks. Remove anything that might make you trip as you walk through a door, such as a raised step or threshold. Trim any bushes or trees on the path to your home. Use bright outdoor lighting. Clear any walking paths of anything that might make someone trip, such as rocks or tools. Regularly check to see if handrails are loose or broken. Make sure that both sides of any steps have handrails. Any raised decks and porches should have guardrails on the edges. Have any leaves, snow, or ice cleared regularly. Use sand or salt on walking paths during winter. Clean up any spills in your garage right away. This includes oil or grease spills. What can I do in the bathroom? Use night lights. Install grab bars by the toilet and in the tub and shower. Do not use towel bars as grab bars. Use non-skid mats or decals in the tub or shower. If you need to sit down in the shower, use a plastic, non-slip stool. Keep the floor dry. Clean up any water that spills on the floor as soon as it happens. Remove soap buildup in the tub or shower regularly. Attach bath mats securely with double-sided non-slip rug tape. Do not have throw rugs and other things on the  floor that can make you trip. What can I do in the bedroom? Use night lights. Make sure that you have a light by your bed that is easy to reach. Do not use any sheets or blankets that are too big for your bed. They should not hang down onto the floor. Have a firm chair that has side arms. You can use this for support while you get dressed. Do not have throw rugs and other things on the floor that can make you trip. What can I do in the kitchen? Clean up any spills right away. Avoid walking on wet floors. Keep items that you use a lot in easy-to-reach places. If you need to reach something above you, use a strong step stool that has a grab bar. Keep electrical cords out of the way. Do not use floor polish or wax that makes floors slippery. If you must use wax, use non-skid floor wax. Do not have throw rugs and other things on the floor that can make you trip. What can I do with my stairs? Do not leave any items on the stairs. Make sure that there are handrails on both sides of the stairs and use them. Fix handrails that are broken or loose. Make sure that handrails are as long as the stairways. Check any carpeting to make sure that it is firmly attached to the stairs. Fix any carpet that is loose or worn. Avoid having throw rugs at the top or bottom  of the stairs. If you do have throw rugs, attach them to the floor with carpet tape. Make sure that you have a light switch at the top of the stairs and the bottom of the stairs. If you do not have them, ask someone to add them for you. What else can I do to help prevent falls? Wear shoes that: Do not have high heels. Have rubber bottoms. Are comfortable and fit you well. Are closed at the toe. Do not wear sandals. If you use a stepladder: Make sure that it is fully opened. Do not climb a closed stepladder. Make sure that both sides of the stepladder are locked into place. Ask someone to hold it for you, if possible. Clearly mark and make  sure that you can see: Any grab bars or handrails. First and last steps. Where the edge of each step is. Use tools that help you move around (mobility aids) if they are needed. These include: Canes. Walkers. Scooters. Crutches. Turn on the lights when you go into a dark area. Replace any light bulbs as soon as they burn out. Set up your furniture so you have a clear path. Avoid moving your furniture around. If any of your floors are uneven, fix them. If there are any pets around you, be aware of where they are. Review your medicines with your doctor. Some medicines can make you feel dizzy. This can increase your chance of falling. Ask your doctor what other things that you can do to help prevent falls. This information is not intended to replace advice given to you by your health care provider. Make sure you discuss any questions you have with your health care provider. Document Released: 02/18/2009 Document Revised: 09/30/2015 Document Reviewed: 05/29/2014 Elsevier Interactive Patient Education  2017 [employer].

## 2021-03-21 NOTE — Progress Notes (Signed)
Subjective:   Jamie Sparks is a 65 y.o. male who presents for an Initial Medicare Annual Wellness Visit.  I connected with Jamie Sparks today by telephone and verified that I am speaking with the correct person using two identifiers. Location patient: home Location provider: work Persons participating in the virtual visit: patient, Engineer, civil (consulting).    I discussed the limitations, risks, security and privacy concerns of performing an evaluation and management service by telephone and the availability of in person appointments. I also discussed with the patient that there may be a patient responsible charge related to this service. The patient expressed understanding and verbally consented to this telephonic visit.    Interactive audio and video telecommunications were attempted between this provider and patient, however failed, due to patient having technical difficulties OR patient did not have access to video capability.  We continued and completed visit with audio only.  Some vital signs may be absent or patient reported.   Time Spent with patient on telephone encounter: 20 minutes   Review of Systems     Cardiac Risk Factors include: advanced age (>74men, >70 women);male gender;hypertension;dyslipidemia;obesity (BMI >30kg/m2)     Objective:    Today's Vitals   03/21/21 4259  Weight: 239 lb (108.4 kg)  Height: 6' (1.829 m)   Body mass index is 32.41 kg/m.  Advanced Directives 03/21/2021 02/25/2021 01/15/2020  Does Patient Have a Medical Advance Directive? No Yes No  Type of Advance Directive - Healthcare Power of Jamie Sparks;Living will -  Would patient like information on creating a medical advance directive? No - Patient declined - -    Current Medications (verified) Outpatient Encounter Medications as of 03/21/2021  Medication Sig   amLODipine (NORVASC) 5 MG tablet Take 1 tablet (5 mg total) by mouth daily.   benazepril (LOTENSIN) 10 MG tablet TAKE 1 TABLET BY MOUTH  EVERY DAY   co-enzyme Q-10 30 MG capsule Take 30 mg by mouth 3 (three) times daily.   Multiple Vitamin (MULTIVITAMIN) tablet Take 1 tablet by mouth daily.   triamcinolone cream (KENALOG) 0.5 % APPLY TO AFFECTED AREA TWICE A DAY   oxybutynin (DITROPAN XL) 5 MG 24 hr tablet Take 1 tablet (5 mg total) by mouth at bedtime. (Patient not taking: Reported on 03/21/2021)   rosuvastatin (CRESTOR) 10 MG tablet Take 1 tablet (10 mg total) by mouth daily. (Patient not taking: Reported on 03/21/2021)   No facility-administered encounter medications on file as of 03/21/2021.    Allergies (verified) Patient has no known allergies.   History: Past Medical History:  Diagnosis Date   Hypertension    History reviewed. No pertinent surgical history. Family History  Problem Relation Age of Onset   Hypertension Mother    Hypertension Sister    Hypertension Sister    Social History   Socioeconomic History   Marital status: Married    Spouse name: Not on file   Number of children: Not on file   Years of education: Not on file   Highest education level: Not on file  Occupational History   Not on file  Tobacco Use   Smoking status: Never   Smokeless tobacco: Never  Substance and Sexual Activity   Alcohol use: Yes   Drug use: No   Sexual activity: Not on file  Other Topics Concern   Not on file  Social History Narrative   Right handed   One story home   Drinks caffeine   Social Determinants of Corporate investment banker  Strain: Low Risk    Difficulty of Paying Living Expenses: Not hard at all  Food Insecurity: No Food Insecurity   Worried About Programme researcher, broadcasting/film/video in the Last Year: Never true   Ran Out of Food in the Last Year: Never true  Transportation Needs: No Transportation Needs   Lack of Transportation (Medical): No   Lack of Transportation (Non-Medical): No  Physical Activity: Sufficiently Active   Days of Exercise per Week: 7 days   Minutes of Exercise per Session: 120 min   Stress: No Stress Concern Present   Feeling of Stress : Not at all  Social Connections: Moderately Isolated   Frequency of Communication with Friends and Family: More than three times a week   Frequency of Social Gatherings with Friends and Family: More than three times a week   Attends Religious Services: Never   Database administrator or Organizations: No   Attends Engineer, structural: Never   Marital Status: Married    Tobacco Counseling Counseling given: Not Answered   Clinical Intake:  Pre-visit preparation completed: Yes  Pain : No/denies pain     BMI - recorded: 32.41 Nutritional Status: BMI > 30  Obese Nutritional Risks: None Diabetes: No  How often do you need to have someone help you when you read instructions, pamphlets, or other written materials from your doctor or pharmacy?: 1 - Never  Diabetic?No  Interpreter Needed?: No  Information entered by :: Jamie Sales LPN   Activities of Daily Living In your present state of health, do you have any difficulty performing the following activities: 03/21/2021 02/28/2021  Hearing? N N  Vision? N N  Difficulty concentrating or making decisions? N N  Walking or climbing stairs? N N  Dressing or bathing? N N  Doing errands, shopping? N N  Preparing Food and eating ? N -  Using the Toilet? N -  In the past six months, have you accidently leaked urine? N -  Do you have problems with loss of bowel control? N -  Managing your Medications? N -  Managing your Finances? N -  Housekeeping or managing your Housekeeping? N -  Some recent data might be hidden    Patient Care Team: Sharlene Dory, DO as PCP - General (Family Medicine)  Indicate any recent Medical Services you may have received from other than [facility] providers in the past year (date may be approximate).     Assessment:   This is a routine wellness examination for [name].  Hearing/Vision screen Hearing Screening -  Comments:: No issues Vision Screening - Comments:: Wears glasses Last eye exam-2022  Dietary issues and exercise activities discussed: Current Exercise Habits: Home exercise routine, Type of exercise: strength training/weights;walking, Time (Minutes): 60, Frequency (Times/Week): 7, Weekly Exercise (Minutes/Week): 420, Intensity: Mild, Exercise limited by: None identified   Goals Addressed             This Visit's Progress    Patient Stated       Maintain current health       Depression Screen PHQ 2/9 Scores 03/21/2021 02/28/2021 11/24/2019  PHQ - 2 Score 0 0 0    Fall Risk Fall Risk  02/25/2021 01/15/2020  Falls in the past year? 0 0  Number falls in past yr: 0 0  Injury with Fall? 0 0    FALL RISK PREVENTION PERTAINING TO THE HOME:  Any stairs in or around the home? No  Home free of loose throw rugs  in walkways, pet beds, electrical cords, etc? Yes  Adequate lighting in your home to reduce risk of falls? Yes   ASSISTIVE DEVICES UTILIZED TO PREVENT FALLS:  Life alert? No  Use of a cane, walker or w/c? No  Grab bars in the bathroom? No  Shower chair or bench in shower? No  Elevated toilet seat or a handicapped toilet? No   TIMED UP AND GO:  Was the test performed? No . Phone visit   Cognitive Function:Normal cognitive status assessed by this Nurse Health Advisor. No abnormalities found.          Immunizations Immunization History  Administered Date(s) Administered   PFIZER(Purple Top)SARS-COV-2 Vaccination 12/29/2019, 01/19/2020   Pneumococcal Polysaccharide-23 09/08/2019    TDAP status: Due, Education has been provided regarding the importance of this vaccine. Advised may receive this vaccine at local pharmacy or Health Dept. Aware to provide a copy of the vaccination record if obtained from local pharmacy or Health Dept. Verbalized acceptance and understanding.  Flu Vaccine status: Declined, Education has been provided regarding the importance of this  vaccine but patient still declined. Advised may receive this vaccine at local pharmacy or Health Dept. Aware to provide a copy of the vaccination record if obtained from local pharmacy or Health Dept. Verbalized acceptance and understanding.  Pneumococcal vaccine status: Due, Education has been provided regarding the importance of this vaccine. Advised may receive this vaccine at local pharmacy or Health Dept. Aware to provide a copy of the vaccination record if obtained from local pharmacy or Health Dept. Verbalized acceptance and understanding.  Covid-19 vaccine status: Information provided on how to obtain vaccines.   Qualifies for Shingles Vaccine? Yes   Zostavax completed No   Shingrix Completed?: No.    Education has been provided regarding the importance of this vaccine. Patient has been advised to call insurance company to determine out of pocket expense if they have not yet received this vaccine. Advised may also receive vaccine at local pharmacy or Health Dept. Verbalized acceptance and understanding.  Screening Tests Health Maintenance  Topic Date Due   Zoster Vaccines- Shingrix (1 of 2) Never done   COVID-19 Vaccine (3 - Pfizer risk series) 02/16/2020   Pneumonia Vaccine 80+ Years old (2 - PCV) 09/07/2020   INFLUENZA VACCINE  08/05/2021 (Originally 12/06/2020)   Fecal DNA (Cologuard)  09/18/2022   Hepatitis C Screening  Completed   HPV VACCINES  Aged Out   TETANUS/TDAP  Discontinued    Health Maintenance  Health Maintenance Due  Topic Date Due   Zoster Vaccines- Shingrix (1 of 2) Never done   COVID-19 Vaccine (3 - Pfizer risk series) 02/16/2020   Pneumonia Vaccine 60+ Years old (2 - PCV) 09/07/2020    Colorectal cancer screening: Type of screening: Cologuard. Completed 09/18/2019. Repeat every 3 years  Lung Cancer Screening: (Low Dose CT Chest recommended if Age 61-80 years, 30 pack-year currently smoking OR have quit w/in 15years.) does not qualify.     Additional  Screening:  Hepatitis C Screening: Completed 09/08/2019  Vision Screening: Recommended annual ophthalmology exams for early detection of glaucoma and other disorders of the eye. Is the patient up to date with their annual eye exam?  Yes  Who is the provider or what is the name of the office in which the patient attends annual eye exams? Pt unsure of name   Dental Screening: Recommended annual dental exams for proper oral hygiene  Community Resource Referral / Chronic Care Management: CRR required this visit?  No   CCM required this visit?  No      Plan:     I have personally reviewed and noted the following in the patient's chart:   Medical and social history Use of alcohol, tobacco or illicit drugs  Current medications and supplements including opioid prescriptions. Patient is not currently taking opioid prescriptions. Functional ability and status Nutritional status Physical activity Advanced directives List of other physicians Hospitalizations, surgeries, and ER visits in previous 12 months Vitals Screenings to include cognitive, depression, and falls Referrals and appointments  In addition, I have reviewed and discussed with patient certain preventive protocols, quality metrics, and best practice recommendations. A written personalized care plan for preventive services as well as general preventive health recommendations were provided to patient.   Due to this being a telephonic visit, the after visit summary with patients personalized plan was offered to patient via mail or my-chart.  Patient would like to access on my-chart.   Roanna Raider, LPN   03/04/2535  Nurse Health Advisor  Nurse Notes: None

## 2021-03-22 ENCOUNTER — Other Ambulatory Visit: Payer: Self-pay

## 2021-03-22 ENCOUNTER — Encounter: Payer: Self-pay | Admitting: Family Medicine

## 2021-03-22 ENCOUNTER — Ambulatory Visit (INDEPENDENT_AMBULATORY_CARE_PROVIDER_SITE_OTHER): Payer: Medicare Other | Admitting: Family Medicine

## 2021-03-22 VITALS — BP 162/88 | HR 98 | Temp 98.6°F | Ht 72.0 in | Wt 243.2 lb

## 2021-03-22 DIAGNOSIS — Z23 Encounter for immunization: Secondary | ICD-10-CM | POA: Diagnosis not present

## 2021-03-22 DIAGNOSIS — R2689 Other abnormalities of gait and mobility: Secondary | ICD-10-CM

## 2021-03-22 DIAGNOSIS — I10 Essential (primary) hypertension: Secondary | ICD-10-CM

## 2021-03-22 MED ORDER — ENALAPRIL MALEATE 5 MG PO TABS: 5.0000 mg | ORAL_TABLET | Freq: Every day | ORAL | 3 refills | Status: DC

## 2021-03-22 MED ORDER — AMLODIPINE BESYLATE 10 MG PO TABS: 10.0000 mg | ORAL_TABLET | Freq: Every day | ORAL | 2 refills | Status: DC

## 2021-03-22 NOTE — Progress Notes (Signed)
Chief Complaint  Patient presents with   Follow-up    Medication problem    Subjective Jamie Sparks is a 65 y.o. male who presents for hypertension follow up. He does not monitor home blood pressures. He is compliant with medications- Norvasc 5 mg/d, benazepril 10 mg/d.  Patient has these side effects of medication: balance issues getting worse after 2 mo on the latter med.  He is adhering to a healthy diet overall. Current exercise: cycling, lifting wts, walking No CP or SOB.    Past Medical History:  Diagnosis Date   Hypertension     Exam BP (!) 162/88   Pulse 98   Temp 98.6 F (37 C) (Oral)   Ht 6' (1.829 m)   Wt 243 lb 4 oz (110.3 kg)   SpO2 99%   BMI 32.99 kg/m  General:  well developed, well nourished, in no apparent distress Heart: RRR, no bruits, no LE edema Lungs: clear to auscultation, no accessory muscle use Psych: well oriented with normal range of affect and appropriate judgment/insight  Essential hypertension  Balance problem - Plan: Ambulatory referral to Neurology  Need for vaccination against Streptococcus pneumoniae - Plan: Pneumococcal conjugate vaccine 20-valent (Prevnar 20)  Chronic, uncontrolled. Stop benazapril. Start enalapril. Increase Norvasc to 10 mg/d. Monitor BP at home. Goal for him is <150/90. Counseled on diet and exercise. Refer to [facility]. Offered PT but he declined.  F/u in as originally scheduled if BP is under control, in 3-4 weeks if not. . The patient voiced understanding and agreement to the plan.  Jilda Roche [place], DO 03/22/21  11:43 AM

## 2021-03-22 NOTE — Patient Instructions (Addendum)
Keep the diet clean and stay active.  Let me know if you want to see PT at any time.  Take 2 tabs of your Norvasc until you run out. A new dose has been sent in.  Stop the benazepril.   Monitor blood pressure at home, if >150/90 consistently over the next few weeks, I want to see you again. Otherwise follow up as originally scheduled.   The new Shingrix vaccine (for shingles) is a 2 shot series. It can make people feel low energy, achy and almost like they have the flu for 48 hours after injection. Please plan accordingly when deciding on when to get this shot. Call your pharmacy to get this. The second shot of the series is less severe regarding the side effects, but it still lasts 48 hours.   I recommend getting the updated bivalent covid vaccination booster at your convenience.   Let us know if you need anything.

## 2021-04-04 ENCOUNTER — Other Ambulatory Visit: Payer: Self-pay

## 2021-04-04 ENCOUNTER — Encounter: Payer: Self-pay | Admitting: Family Medicine

## 2021-04-04 ENCOUNTER — Ambulatory Visit (INDEPENDENT_AMBULATORY_CARE_PROVIDER_SITE_OTHER): Payer: Medicare Other | Admitting: Family Medicine

## 2021-04-04 VITALS — BP 142/80 | HR 89 | Temp 98.4°F | Ht 72.0 in | Wt 243.0 lb

## 2021-04-04 DIAGNOSIS — I10 Essential (primary) hypertension: Secondary | ICD-10-CM

## 2021-04-04 DIAGNOSIS — R2689 Other abnormalities of gait and mobility: Secondary | ICD-10-CM

## 2021-04-04 MED ORDER — CLOBETASOL PROPIONATE 0.05 % EX OINT: 1.0000 "application " | TOPICAL_OINTMENT | Freq: Two times a day (BID) | CUTANEOUS | 2 refills | Status: DC

## 2021-04-04 NOTE — Patient Instructions (Signed)
If you do not hear anything about your referral in the next 1-2 weeks, call our office and ask for an update.  Because your blood pressure is well-controlled, you no longer have to check your blood pressure at home anymore unless you wish. Some people check it twice daily every day and some people stop altogether. Either or anything in between is fine. Strong work!  Keep the diet clean and stay active.  Let us know if you need anything.

## 2021-04-04 NOTE — Progress Notes (Signed)
Chief Complaint  Patient presents with   Follow-up    Discuss PT    Subjective Jamie Sparks is a 65 y.o. male who presents for hypertension follow up. He does monitor home blood pressures. Blood pressures ranging from 130-140's/80's on average. He is compliant with medications- Norvasc 10 mg/d, enalapril 5 mg/d. Patient has these side effects of medication: none He is adhering to a healthy diet overall. Current exercise: cycling, waking, wt lifting No Cp or SOB.  Balance Chronic balance issues, went to Neuro and imaging neg. Had PT in past that helped, not working with them now.  Larey Seat a few d ago. Interested in going back to PT. Lifts wts and walks/cycles routinely.   Past Medical History:  Diagnosis Date   Hypertension     Exam BP (!) 142/80   Pulse 89   Temp 98.4 F (36.9 C) (Oral)   Ht 6' (1.829 m)   Wt 243 lb (110.2 kg)   SpO2 99%   BMI 32.96 kg/m  General:  well developed, well nourished, in no apparent distress Heart: RRR, no bruits, no LE edema Lungs: clear to auscultation, no accessory muscle use Psych: well oriented with normal range of affect and appropriate judgment/insight  Balance problem - Plan: Ambulatory referral to Physical Therapy  Primary hypertension  Chronic, stable. Cont Norvasc 10 mg/d, enalapril 5 mg/d. Goal is <150/90 for his age and difficulty with other medications. OK to stop checking BP at home. Counseled on diet and exercise. Chronic, unstable. Pending Neuro 2nd opinion. Refer to PT.  F/u in 6 mo. The patient voiced understanding and agreement to the plan.  Jilda Roche [place], DO 04/04/21  11:14 AM

## 2021-04-11 ENCOUNTER — Ambulatory Visit (INDEPENDENT_AMBULATORY_CARE_PROVIDER_SITE_OTHER): Payer: Medicare Other | Admitting: Physical Therapy

## 2021-04-11 ENCOUNTER — Other Ambulatory Visit: Payer: Self-pay

## 2021-04-11 DIAGNOSIS — M6281 Muscle weakness (generalized): Secondary | ICD-10-CM

## 2021-04-11 DIAGNOSIS — R2689 Other abnormalities of gait and mobility: Secondary | ICD-10-CM

## 2021-04-11 DIAGNOSIS — R2681 Unsteadiness on feet: Secondary | ICD-10-CM | POA: Diagnosis not present

## 2021-04-11 NOTE — Therapy (Signed)
[facility] [place] [address] [place], [place], [address] Phone: [phone]   Fax:  [phone]  Physical Therapy Evaluation  Patient Details  Name: Jamie Sparks MRN: [id number] Date of Birth: [birth year] Referring Provider (PT): Sharlene Dory, [place]   Encounter Date: 04/11/2021   PT End of Session - 04/11/21 1115     Visit Number 1    Number of Visits 6    Date for PT Re-Evaluation 05/23/21    Authorization Type Medicare    PT Start Time 1015    PT Stop Time 1105    PT Time Calculation (min) 50 min    Equipment Utilized During Treatment Gait belt    Activity Tolerance Patient tolerated treatment well    Behavior During Therapy [facility] for tasks assessed/performed             Past Medical History:  Diagnosis Date   Hypertension     No past surgical history on file.  There were no vitals filed for this visit.    Subjective Assessment - 04/11/21 1022     Subjective Pt reports he went fly fishing in [place] and to get from the water to the bank he fell backwards all the way down a ravine. Pt reports his balance isn't great. He goes to the gym daily. Reports some instability with stairs as well    Limitations Walking;House hold activities    How long can you sit comfortably? n/a    How long can you stand comfortably? n/a    How long can you walk comfortably? n/a    Patient Stated Goals Improve balance    Currently in Pain? No/denies                [facility] PT Assessment - 04/11/21 0001       Assessment   Medical Diagnosis R26.89 (ICD-10-CM) - Balance problem    Referring Provider (PT) Carmelia Roller, Jilda Roche, DO    Onset Date/Surgical Date --   This past weekend   Hand Dominance Right    Prior Therapy Balance ~1 year ago      Precautions   Precautions Fall      Restrictions   Weight Bearing Restrictions No      Balance Screen   Has the patient fallen in the past 6 months Yes     How many times? 1   last weekend   Has the patient had a decrease in activity level because of a fear of falling?  No    Is the patient reluctant to leave their home because of a fear of falling?  No      Home Environment   Living Environment Private residence    Living Arrangements Spouse/significant other    Available Help at Discharge Family    Type of Home House      Prior Function   Level of Independence Independent    Leisure Working out      Observation/Other Assessments   Focus on Therapeutic Outcomes (FOTO)  97 (risk adjusted 57), predicted 98      Transfers   Five time sit to stand comments  21 sec      Ambulation/Gait   Ambulation Distance (Feet) --   Around gym   Assistive device None    Gait Pattern Step-through pattern;Decreased stride length;Abducted - left;Abducted- right    Ambulation Surface Level;Indoor    Gait velocity 0.78 m/s      Standardized Balance Assessment  Standardized Balance Assessment Berg Balance Test      Berg Balance Test   Sit to Stand Able to stand without using hands and stabilize independently    Standing Unsupported Able to stand safely 2 minutes    Sitting with Back Unsupported but Feet Supported on Floor or Stool Able to sit safely and securely 2 minutes    Stand to Sit Sits safely with minimal use of hands    Transfers Able to transfer safely, minor use of hands    Standing Unsupported with Eyes Closed Able to stand 10 seconds safely    Standing Unsupported with Feet Together Able to place feet together independently and stand for 1 minute with supervision    From Standing, Reach Forward with Outstretched Arm Can reach confidently >25 cm (10")    From Standing Position, Pick up Object from Floor Able to pick up shoe safely and easily    From Standing Position, Turn to Look Behind Over each Shoulder Looks behind from both sides and weight shifts well    Turn 360 Degrees Able to turn 360 degrees safely but slowly    Standing  Unsupported, Alternately Place Feet on Step/Stool Able to stand independently and complete 8 steps >20 seconds    Standing Unsupported, One Foot in Front Able to take small step independently and hold 30 seconds    Standing on One Leg Tries to lift leg/unable to hold 3 seconds but remains standing independently    Total Score 47    Berg comment: 47/56      High Level Balance   High Level Balance Comments mCTSIB: situation 1: 30 sec, situation 2: 30 sec, situation 3: 30 sec with increased sway, situation 4: 30 sec      Functional Gait  Assessment   Gait assessed  Yes    Gait Level Surface Walks 20 ft in less than 7 sec but greater than 5.5 sec, uses assistive device, slower speed, mild gait deviations, or deviates 6-10 in outside of the 12 in walkway width.    Change in Gait Speed Able to change speed, demonstrates mild gait deviations, deviates 6-10 in outside of the 12 in walkway width, or no gait deviations, unable to achieve a major change in velocity, or uses a change in velocity, or uses an assistive device.    Gait with Horizontal Head Turns Performs head turns smoothly with slight change in gait velocity (eg, minor disruption to smooth gait path), deviates 6-10 in outside 12 in walkway width, or uses an assistive device.    Gait with Vertical Head Turns Performs task with slight change in gait velocity (eg, minor disruption to smooth gait path), deviates 6 - 10 in outside 12 in walkway width or uses assistive device    Gait and Pivot Turn Turns slowly, requires verbal cueing, or requires several small steps to catch balance following turn and stop    Step Over Obstacle Is able to step over one shoe box (4.5 in total height) but must slow down and adjust steps to clear box safely. May require verbal cueing.    Gait with Narrow Base of Support Ambulates less than 4 steps heel to toe or cannot perform without assistance.    Gait with Eyes Closed Walks 20 ft, slow speed, abnormal gait pattern,  evidence for imbalance, deviates 10-15 in outside 12 in walkway width. Requires more than 9 sec to ambulate 20 ft.    Ambulating Backwards Walks 20 ft, slow speed, abnormal gait  pattern, evidence for imbalance, deviates 10-15 in outside 12 in walkway width.    Steps Alternating feet, must use rail.   LOB   Total Score 14    FGA comment: 14/30                        Objective measurements completed on examination: See above findings.                PT Education - 04/11/21 1116     Education Details Exam findings, HEP and POC    Person(s) Educated Patient    Methods Explanation;Demonstration;Tactile cues;Verbal cues;Handout    Comprehension Verbalized understanding;Returned demonstration;Verbal cues required;Tactile cues required                 PT Long Term Goals - 04/11/21 1121       PT LONG TERM GOAL #1   Title The patient will be indep with HEP for balance, LE strengthening, flexibility.    Time 6    Period Weeks    Status New    Target Date 05/23/21      PT LONG TERM GOAL #2   Title The patient will improve FGA from 14/30 to > or equal to 18/30 to demo improving dynamic balance control.    Time 6    Period Weeks    Status New    Target Date 05/09/21      PT LONG TERM GOAL #3   Title Pt will have improved Berg Balance Score to at least 52/56 to categorize as a low fall risk    Baseline 47/56 indicating moderate fall risk    Time 6    Period Weeks    Status New    Target Date 05/23/21      PT LONG TERM GOAL #4   Title Pt will demo improved functional hip strength with 5 x STS <15 sec    Baseline 21 sec    Time 6    Period Weeks    Status New    Target Date 05/23/21      PT LONG TERM GOAL #5   Title Pt will have improved FOTO score to at least 98    Baseline 97    Time 6    Period Weeks    Status New    Target Date 05/23/21                    Plan - 04/11/21 1117     Clinical Impression Statement Mr.  Michaeal Davis is an active 65 y/o M presenting to OPPT due to complaint of decreased balance and instability. On assessment, pt demos poor stability with weight shifting/single leg stability, posterior hip weakness, and decreased dynamic gait placing him at high fall risk and slow gait speed. Pt would benefit from PT to improve his overall home and community mobility and decrease his risk of falls.    Personal Factors and Comorbidities Age;Time since onset of injury/illness/exacerbation;Fitness    Examination-Activity Limitations Stairs;Locomotion Level;Squat    Examination-Participation Restrictions Community Activity;Yard Work;Other   Recreational tasks   Stability/Clinical Decision Making Stable/Uncomplicated    Clinical Decision Making Low    Rehab Potential Good    PT Frequency 1x / week    PT Duration 6 weeks    PT Treatment/Interventions ADLs/Self Care Home Management;Aquatic Therapy;Cryotherapy;Electrical Stimulation;Moist Heat;Iontophoresis 4mg /ml Dexamethasone;Gait training;Stair training;Functional mobility training;Therapeutic activities;Therapeutic exercise;Balance training;Neuromuscular re-education;Patient/family education;Manual techniques;Dry needling;Passive range of motion;Taping;Vestibular  PT Next Visit Plan Progress and modify HEP as able. Continue to work on posterior hip strengthening, tandem stance into single leg stability, weight shifting, dynamic gait    PT Home Exercise Plan Access Code: [email or web address]    Consulted and Agree with Plan of Care Patient             Patient will benefit from skilled therapeutic intervention in order to improve the following deficits and impairments:  Abnormal gait, Difficulty walking, Decreased balance, Decreased mobility, Decreased strength  Visit Diagnosis: Other abnormalities of gait and mobility  Unsteadiness on feet  Muscle weakness (generalized)     Problem List Patient Active Problem List   Diagnosis Date Noted    Chiari I malformation (HCC) 02/25/2021   Mixed hyperlipidemia 08/13/2020   Essential hypertension 02/10/2020   Obesity (BMI 30.0-34.9) 02/11/2019   Sinusitis 09/21/2011   Right hip pain 06/23/2011   Right ankle pain 06/23/2011   Back pain 02/02/2011   Murmur 01/27/2011   Musculoskeletal leg pain 10/26/2010   HTN (hypertension) 10/26/2010    [facility] April Dell [place], PT, DPT 04/11/2021, 11:28 AM  [facility] [address] [place], [place], [address] Phone: [phone]   Fax:  [phone]  Name: Jamie Sparks MRN: [id number] Date of Birth: [birth year]

## 2021-04-21 ENCOUNTER — Other Ambulatory Visit: Payer: Self-pay

## 2021-04-21 ENCOUNTER — Ambulatory Visit (INDEPENDENT_AMBULATORY_CARE_PROVIDER_SITE_OTHER): Payer: Medicare Other | Admitting: Physical Therapy

## 2021-04-21 DIAGNOSIS — R2681 Unsteadiness on feet: Secondary | ICD-10-CM

## 2021-04-21 DIAGNOSIS — M6281 Muscle weakness (generalized): Secondary | ICD-10-CM | POA: Diagnosis not present

## 2021-04-21 DIAGNOSIS — R2689 Other abnormalities of gait and mobility: Secondary | ICD-10-CM | POA: Diagnosis present

## 2021-04-21 NOTE — Therapy (Signed)
[facility] [place] [address] [place], [place], [address] Phone: [phone]   Fax:  [phone]  Physical Therapy Treatment  Patient Details  Name: Jamie Sparks MRN: [id number] Date of Birth: Sep 24, 1955 Referring Provider (PT): Sharlene Dory, [place]   Encounter Date: 04/21/2021   PT End of Session - 04/21/21 1015     Visit Number 2    Number of Visits 6    Date for PT Re-Evaluation 05/23/21    Authorization Type Medicare    PT Start Time 1015    PT Stop Time 1100    PT Time Calculation (min) 45 min    Equipment Utilized During Treatment Gait belt    Activity Tolerance Patient tolerated treatment well    Behavior During Therapy [facility] for tasks assessed/performed             Past Medical History:  Diagnosis Date   Hypertension     No past surgical history on file.  There were no vitals filed for this visit.   Subjective Assessment - 04/21/21 1019     Subjective Pt states he's been going to the gym daily. He has been working on intermittent fast walking on treadmill. Pt notes no issues with exercises    Limitations Walking;House hold activities    How long can you sit comfortably? n/a    How long can you stand comfortably? n/a    How long can you walk comfortably? n/a    Patient Stated Goals Improve balance    Currently in Pain? No/denies                               [facility] Adult PT Treatment/Exercise - 04/21/21 0001       Exercises   Exercises Knee/Hip      Knee/Hip Exercises: Aerobic   Tread Mill Intermittent "fast" walking 1.9 mph to 2.2 mph x 8 min   cues to increase step length     Knee/Hip Exercises: Standing   Hip Extension Stengthening;Right;Left;2 sets;10 reps;Knee straight    Other Standing Knee Exercises Backwards walking initially attempted on treadmill but too difficult for pt; performed x2 laps around track (cues for increased step length)       Knee/Hip Exercises: Supine   Bridges Strengthening;10 reps;Both    Bridges Limitations feet on green pball                 Balance Exercises - 04/21/21 0001       Balance Exercises: Standing   Tandem Stance Eyes open;Intermittent upper extremity support;30 secs   with head turns L & R x10 each   SLS with Vectors Solid surface   Forward and side tap on cones x10 each way R & L LE   Standing, One Foot on a Step Eyes open;4 inch   x10 head turns and head nods                    PT Long Term Goals - 04/11/21 1121       PT LONG TERM GOAL #1   Title The patient will be indep with HEP for balance, LE strengthening, flexibility.    Time 6    Period Weeks    Status New    Target Date 05/23/21      PT LONG TERM GOAL #2   Title The patient will improve FGA from 14/30 to > or equal  to 18/30 to demo improving dynamic balance control.    Time 6    Period Weeks    Status New    Target Date 05/09/21      PT LONG TERM GOAL #3   Title Pt will have improved Berg Balance Score to at least 52/56 to categorize as a low fall risk    Baseline 47/56 indicating moderate fall risk    Time 6    Period Weeks    Status New    Target Date 05/23/21      PT LONG TERM GOAL #4   Title Pt will demo improved functional hip strength with 5 x STS <15 sec    Baseline 21 sec    Time 6    Period Weeks    Status New    Target Date 05/23/21      PT LONG TERM GOAL #5   Title Pt will have improved FOTO score to at least 98    Baseline 97    Time 6    Period Weeks    Status New    Target Date 05/23/21                   Plan - 04/21/21 1055     Clinical Impression Statement Treatment focused on continuing to improve dynamic gait and static balance. Pt with difficulty maintaining tandem stance with head turns and activities with weight shifting. Worked on posterior hip strengthening. Encouraged pt to work on his exercises at home.    Personal Factors and Comorbidities Age;Time  since onset of injury/illness/exacerbation;Fitness    Examination-Activity Limitations Stairs;Locomotion Level;Squat    Examination-Participation Restrictions Community Activity;Yard Work;Other   Recreational tasks   Stability/Clinical Decision Making Stable/Uncomplicated    Rehab Potential Good    PT Frequency 1x / week    PT Duration 6 weeks    PT Treatment/Interventions ADLs/Self Care Home Management;Aquatic Therapy;Cryotherapy;Electrical Stimulation;Moist Heat;Iontophoresis 4mg /ml Dexamethasone;Gait training;Stair training;Functional mobility training;Therapeutic activities;Therapeutic exercise;Balance training;Neuromuscular re-education;Patient/family education;Manual techniques;Dry needling;Passive range of motion;Taping;Vestibular    PT Next Visit Plan Progress and modify HEP as able. Continue to work on posterior hip strengthening, tandem stance into single leg stability, weight shifting, dynamic gait    PT Home Exercise Plan Access Code: [email or web address]    Consulted and Agree with Plan of Care Patient             Patient will benefit from skilled therapeutic intervention in order to improve the following deficits and impairments:  Abnormal gait, Difficulty walking, Decreased balance, Decreased mobility, Decreased strength  Visit Diagnosis: Other abnormalities of gait and mobility  Unsteadiness on feet  Muscle weakness (generalized)     Problem List Patient Active Problem List   Diagnosis Date Noted   Chiari I malformation (HCC) 02/25/2021   Mixed hyperlipidemia 08/13/2020   Essential hypertension 02/10/2020   Obesity (BMI 30.0-34.9) 02/11/2019   Sinusitis 09/21/2011   Right hip pain 06/23/2011   Right ankle pain 06/23/2011   Back pain 02/02/2011   Murmur 01/27/2011   Musculoskeletal leg pain 10/26/2010   HTN (hypertension) 10/26/2010    [facility] April May, PT, DPT 04/21/2021, 12:55 PM  [facility] [address] [place], [place], [place] Phone: [phone]   Fax:  [phone]  Name: Jamie Sparks MRN: [name] Date of Birth: [birth year]

## 2021-04-28 ENCOUNTER — Encounter: Payer: Medicare Other | Admitting: Physical Therapy

## 2021-05-10 ENCOUNTER — Encounter: Payer: Self-pay | Admitting: Family Medicine

## 2021-05-10 ENCOUNTER — Ambulatory Visit (INDEPENDENT_AMBULATORY_CARE_PROVIDER_SITE_OTHER): Payer: Medicare Other | Admitting: Family Medicine

## 2021-05-10 VITALS — BP 126/78 | HR 97 | Temp 98.1°F | Ht 72.0 in | Wt 246.1 lb

## 2021-05-10 DIAGNOSIS — E782 Mixed hyperlipidemia: Secondary | ICD-10-CM | POA: Diagnosis not present

## 2021-05-10 DIAGNOSIS — I10 Essential (primary) hypertension: Secondary | ICD-10-CM

## 2021-05-10 LAB — COMPREHENSIVE METABOLIC PANEL
ALT: 13 U/L (ref 0–53)
AST: 15 U/L (ref 0–37)
Albumin: 4 g/dL (ref 3.5–5.2)
Alkaline Phosphatase: 73 U/L (ref 39–117)
BUN: 20 mg/dL (ref 6–23)
CO2: 27 mEq/L (ref 19–32)
Calcium: 8.9 mg/dL (ref 8.4–10.5)
Chloride: 103 mEq/L (ref 96–112)
Creatinine, Ser: 1.2 mg/dL (ref 0.40–1.50)
GFR: 62.86 mL/min (ref >60.00)
Glucose, Bld: 127 mg/dL — ABNORMAL HIGH (ref 70–99)
Potassium: 4.1 mEq/L (ref 3.5–5.1)
Sodium: 138 mEq/L (ref 135–145)
Total Bilirubin: 0.6 mg/dL (ref 0.2–1.2)
Total Protein: 6.7 g/dL (ref 6.0–8.3)

## 2021-05-10 LAB — LIPID PANEL
Cholesterol: 206 mg/dL — ABNORMAL HIGH (ref 0–200)
HDL: 35.5 mg/dL — ABNORMAL LOW (ref >39.00)
LDL Cholesterol: 132 mg/dL — ABNORMAL HIGH (ref 0–99)
NonHDL: 170.03
Total CHOL/HDL Ratio: 6
Triglycerides: 191 mg/dL — ABNORMAL HIGH (ref 0.0–149.0)
VLDL: 38.2 mg/dL (ref 0.0–40.0)

## 2021-05-10 NOTE — Patient Instructions (Signed)
Give [place] 2-3 business days to get the results of your labs back.   Keep the diet clean and stay active.  We will try a different medication if the cholesterol is still high.   Let us know if you need anything.

## 2021-05-10 NOTE — Progress Notes (Signed)
Chief Complaint  Patient presents with   Follow-up    Subjective Jamie Sparks is a 66 y.o. male who presents for hypertension follow up. He does not monitor home blood pressures. He is compliant with medication- Norvasc 10 mg/d. Patient has these side effects of medication: none He is usually adhering to a healthy diet overall. Current exercise: cycling, walking No CP or SOB.  Hyperlipidemia Patient presents for dyslipidemia follow up. Stopped taking Crestor 10 mg/d due to myalgias.  Diet/exercise as above.  The patient is not known to have coexisting coronary artery disease.   Past Medical History:  Diagnosis Date   Hypertension     Exam BP 126/78    Pulse 97    Temp 98.1 F (36.7 C) (Oral)    Ht 6' (1.829 m)    Wt 246 lb 2 oz (111.6 kg)    SpO2 98%    BMI 33.38 kg/m  General:  well developed, well nourished, in no apparent distress Heart: RRR, no bruits, no LE edema Lungs: clear to auscultation, no accessory muscle use Psych: well oriented with normal range of affect and appropriate judgment/insight  Essential hypertension  Mixed hyperlipidemia - Plan: Lipid panel, Comprehensive metabolic panel  Chronic, stable. Cont Norvasc 10 mg/d. Counseled on diet and exercise. Chronic, unsure if stable. Would try Lipitor at low dosage if not controlled.  F/u in 6 mo. The patient voiced understanding and agreement to the plan.  Jilda Roche [place], DO 05/10/21  8:17 AM

## 2021-05-11 ENCOUNTER — Other Ambulatory Visit: Payer: Self-pay | Admitting: Family Medicine

## 2021-05-11 DIAGNOSIS — E785 Hyperlipidemia, unspecified: Secondary | ICD-10-CM

## 2021-05-11 DIAGNOSIS — R7303 Prediabetes: Secondary | ICD-10-CM

## 2021-05-11 DIAGNOSIS — E782 Mixed hyperlipidemia: Secondary | ICD-10-CM

## 2021-05-11 MED ORDER — ATORVASTATIN CALCIUM 10 MG PO TABS: 10.0000 mg | ORAL_TABLET | Freq: Every day | ORAL | 3 refills | Status: DC

## 2021-05-12 ENCOUNTER — Other Ambulatory Visit: Payer: Self-pay

## 2021-05-12 ENCOUNTER — Ambulatory Visit: Payer: Medicare Other | Attending: Family Medicine | Admitting: Physical Therapy

## 2021-05-12 DIAGNOSIS — M6281 Muscle weakness (generalized): Secondary | ICD-10-CM | POA: Diagnosis present

## 2021-05-12 DIAGNOSIS — R2689 Other abnormalities of gait and mobility: Secondary | ICD-10-CM | POA: Insufficient documentation

## 2021-05-12 DIAGNOSIS — R2681 Unsteadiness on feet: Secondary | ICD-10-CM | POA: Insufficient documentation

## 2021-05-12 NOTE — Therapy (Signed)
[facility] [place] [address] [place] [place] [address] [place], [place], [address] Phone: [phone]   Fax:  [phone]  Physical Therapy Treatment  Patient Details  Name: Jamie Sparks MRN: [id number] Date of Birth: February 02, 1956 Referring Provider (PT): Sharlene Dory, [place]   Encounter Date: 05/12/2021   PT End of Session - 05/12/21 1357     Visit Number 3    Number of Visits 6    Date for PT Re-Evaluation 05/23/21    Authorization Type Medicare    PT Start Time 1357    PT Stop Time 1443    PT Time Calculation (min) 46 min    Equipment Utilized During Treatment Gait belt    Activity Tolerance Patient tolerated treatment well    Behavior During Therapy [facility] for tasks assessed/performed             Past Medical History:  Diagnosis Date   Hypertension     No past surgical history on file.  There were no vitals filed for this visit.   Subjective Assessment - 05/12/21 1359     Subjective Pt states that with the holidays he was not able to do the exercises like he's supposed to. Pt reports that his medication changed to amlodopine by itself and feels that he is moving better with it.    Limitations Walking;House hold activities    How long can you sit comfortably? n/a    How long can you stand comfortably? n/a    How long can you walk comfortably? n/a    Patient Stated Goals Improve balance    Currently in Pain? No/denies                               [facility] Adult PT Treatment/Exercise - 05/12/21 0001       Ambulation/Gait   Gait Comments Backwards walking 1 lap CW & CCW around gym      Knee/Hip Exercises: Aerobic   Tread Mill Intermittent "fast" walking 2.2 - 2.4 mph      Knee/Hip Exercises: Standing   Other Standing Knee Exercises Backwards monster walk green tband 4x10" by counter      Knee/Hip Exercises: Seated   Sit to Sand 2 sets;10 reps;with UE support   standing on airex                 Balance Exercises - 05/12/21 0001       Balance Exercises: Standing   Tandem Stance Eyes open;30 secs;2 reps   2x30 sec static; x10 with head turns and head nods alternating which foot is forward   SLS with Vectors Solid surface   tapping on cone forward & to the side x10                    PT Long Term Goals - 04/11/21 1121       PT LONG TERM GOAL #1   Title The patient will be indep with HEP for balance, LE strengthening, flexibility.    Time 6    Period Weeks    Status New    Target Date 05/23/21      PT LONG TERM GOAL #2   Title The patient will improve FGA from 14/30 to > or equal to 18/30 to demo improving dynamic balance control.    Time 6    Period Weeks    Status New    Target Date 05/09/21  PT LONG TERM GOAL #3   Title Pt will have improved Berg Balance Score to at least 52/56 to categorize as a low fall risk    Baseline 47/56 indicating moderate fall risk    Time 6    Period Weeks    Status New    Target Date 05/23/21      PT LONG TERM GOAL #4   Title Pt will demo improved functional hip strength with 5 x STS <15 sec    Baseline 21 sec    Time 6    Period Weeks    Status New    Target Date 05/23/21      PT LONG TERM GOAL #5   Title Pt will have improved FOTO score to at least 98    Baseline 97    Time 6    Period Weeks    Status New    Target Date 05/23/21                   Plan - 05/12/21 1435     Clinical Impression Statement Reviewed pt's HEP and continued to encourage pt on performing his balance exercises to make gains. Improving gait speed noted on treadmill. Continued working on weight shifting and reactive balance (especially posterior stepping and using ankle strategies).    Personal Factors and Comorbidities Age;Time since onset of injury/illness/exacerbation;Fitness    Examination-Activity Limitations Stairs;Locomotion Level;Squat    Examination-Participation Restrictions Community Activity;Yard  Work;Other   Recreational tasks   Stability/Clinical Decision Making Stable/Uncomplicated    Rehab Potential Good    PT Frequency 1x / week    PT Duration 6 weeks    PT Treatment/Interventions ADLs/Self Care Home Management;Aquatic Therapy;Cryotherapy;Electrical Stimulation;Moist Heat;Iontophoresis 4mg /ml Dexamethasone;Gait training;Stair training;Functional mobility training;Therapeutic activities;Therapeutic exercise;Balance training;Neuromuscular re-education;Patient/family education;Manual techniques;Dry needling;Passive range of motion;Taping;Vestibular    PT Next Visit Plan Progress and modify HEP as able. Continue to work on posterior hip strengthening, sit to stand, tandem stance into single leg stability, weight shifting, dynamic gait    PT Home Exercise Plan Access Code: [email or web address]    Consulted and Agree with Plan of Care Patient             Patient will benefit from skilled therapeutic intervention in order to improve the following deficits and impairments:  Abnormal gait, Difficulty walking, Decreased balance, Decreased mobility, Decreased strength  Visit Diagnosis: Other abnormalities of gait and mobility  Unsteadiness on feet  Muscle weakness (generalized)     Problem List Patient Active Problem List   Diagnosis Date Noted   Chiari I malformation (HCC) 02/25/2021   Mixed hyperlipidemia 08/13/2020   Essential hypertension 02/10/2020   Obesity (BMI 30.0-34.9) 02/11/2019   Sinusitis 09/21/2011   Right hip pain 06/23/2011   Right ankle pain 06/23/2011   Back pain 02/02/2011   Murmur 01/27/2011   Musculoskeletal leg pain 10/26/2010   HTN (hypertension) 10/26/2010    [facility] April May, PT, DPT 05/12/2021, 2:45 PM  [facility] [address] [place], [place], [place] Phone: [phone]   Fax:  [phone]  Name: Jamie Sparks MRN: [name] Date of Birth: 09-27-1955

## 2021-05-19 ENCOUNTER — Ambulatory Visit: Payer: Medicare Other | Admitting: Physical Therapy

## 2021-05-19 ENCOUNTER — Other Ambulatory Visit: Payer: Self-pay

## 2021-05-19 DIAGNOSIS — R2681 Unsteadiness on feet: Secondary | ICD-10-CM

## 2021-05-19 DIAGNOSIS — M6281 Muscle weakness (generalized): Secondary | ICD-10-CM

## 2021-05-19 DIAGNOSIS — R2689 Other abnormalities of gait and mobility: Secondary | ICD-10-CM

## 2021-05-19 NOTE — Therapy (Signed)
[facility] [place] [address] [place] [address] [place], [place], [address] Phone: [phone]   Fax:  [phone]  Physical Therapy Treatment  Patient Details  Name: Jamie Sparks MRN: [id number] Date of Birth: 1956/02/13 Referring Provider (PT): Sharlene Dory, [place]   Encounter Date: 05/19/2021   PT End of Session - 05/19/21 1104     Visit Number 4    Number of Visits 6    Date for PT Re-Evaluation 05/23/21    Authorization Type Medicare    PT Start Time 1104    PT Stop Time 1145    PT Time Calculation (min) 41 min    Equipment Utilized During Treatment Gait belt    Activity Tolerance Patient tolerated treatment well    Behavior During Therapy [facility] for tasks assessed/performed             Past Medical History:  Diagnosis Date   Hypertension     No past surgical history on file.  There were no vitals filed for this visit.   Subjective Assessment - 05/19/21 1112     Subjective Pt reports he has been walking on the treadmill at 2.2-2.3 mph. Pt notes he had some shaking after exercises yesterday. Pt states he has been doing his exercises.    Limitations Walking;House hold activities    How long can you sit comfortably? n/a    How long can you stand comfortably? n/a    How long can you walk comfortably? n/a    Patient Stated Goals Improve balance                                    Balance Exercises - 05/19/21 0001       Balance Exercises: Standing   Tandem Stance Eyes open;30 secs   still performs with difficulty   SLS with Vectors Solid surface   tap forward on cone 2x10 each leg, tap side on cone 2x10 each leg, tap backward on cone 2x10 each leg   Other Standing Exercises Sport cord: forward, bacward, L & R walking x10 each                     PT Long Term Goals - 04/11/21 1121       PT LONG TERM GOAL #1   Title The patient will be indep with HEP for balance, LE  strengthening, flexibility.    Time 6    Period Weeks    Status New    Target Date 05/23/21      PT LONG TERM GOAL #2   Title The patient will improve FGA from 14/30 to > or equal to 18/30 to demo improving dynamic balance control.    Time 6    Period Weeks    Status New    Target Date 05/09/21      PT LONG TERM GOAL #3   Title Pt will have improved Berg Balance Score to at least 52/56 to categorize as a low fall risk    Baseline 47/56 indicating moderate fall risk    Time 6    Period Weeks    Status New    Target Date 05/23/21      PT LONG TERM GOAL #4   Title Pt will demo improved functional hip strength with 5 x STS <15 sec    Baseline 21 sec    Time 6  Period Weeks    Status New    Target Date 05/23/21      PT LONG TERM GOAL #5   Title Pt will have improved FOTO score to at least 98    Baseline 97    Time 6    Period Weeks    Status New    Target Date 05/23/21                   Plan - 05/19/21 1125     Clinical Impression Statement Pt with improved backwards walking this session. Able to maintain single leg with vectors forward and side ways -- progressed him to include backwards vectors with greater difficulty. Continued to work on reactive balance and eccentric control with sports cord this session.    Personal Factors and Comorbidities Age;Time since onset of injury/illness/exacerbation;Fitness    Examination-Activity Limitations Stairs;Locomotion Level;Squat    Examination-Participation Restrictions Community Activity;Yard Work;Other   Recreational tasks   Stability/Clinical Decision Making Stable/Uncomplicated    Rehab Potential Good    PT Frequency 1x / week    PT Duration 6 weeks    PT Treatment/Interventions ADLs/Self Care Home Management;Aquatic Therapy;Cryotherapy;Electrical Stimulation;Moist Heat;Iontophoresis 4mg /ml Dexamethasone;Gait training;Stair training;Functional mobility training;Therapeutic activities;Therapeutic exercise;Balance  training;Neuromuscular re-education;Patient/family education;Manual techniques;Dry needling;Passive range of motion;Taping;Vestibular    PT Next Visit Plan Recheck goals for Re-cert. Progress and modify HEP as able. Continue to work on posterior hip strengthening, sit to stand, tandem stance into single leg stability, weight shifting, dynamic gait    PT Home Exercise Plan Access Code: [email or web address]    Consulted and Agree with Plan of Care Patient             Patient will benefit from skilled therapeutic intervention in order to improve the following deficits and impairments:  Abnormal gait, Difficulty walking, Decreased balance, Decreased mobility, Decreased strength  Visit Diagnosis: Other abnormalities of gait and mobility  Unsteadiness on feet  Muscle weakness (generalized)     Problem List Patient Active Problem List   Diagnosis Date Noted   Chiari I malformation (HCC) 02/25/2021   Mixed hyperlipidemia 08/13/2020   Essential hypertension 02/10/2020   Obesity (BMI 30.0-34.9) 02/11/2019   Sinusitis 09/21/2011   Right hip pain 06/23/2011   Right ankle pain 06/23/2011   Back pain 02/02/2011   Murmur 01/27/2011   Musculoskeletal leg pain 10/26/2010   HTN (hypertension) 10/26/2010    [facility] April May, PT, DPT 05/19/2021, 11:43 AM  [facility] [address] [place] [address] [place], [place], [place] Phone: [phone]   Fax:  [phone]  Name: Jamie Sparks MRN: [name] Date of Birth: 1956/04/18

## 2021-05-26 ENCOUNTER — Other Ambulatory Visit: Payer: Self-pay

## 2021-05-26 ENCOUNTER — Ambulatory Visit: Payer: Medicare Other | Admitting: Physical Therapy

## 2021-05-26 ENCOUNTER — Other Ambulatory Visit: Payer: Self-pay | Admitting: Family Medicine

## 2021-05-26 DIAGNOSIS — R2689 Other abnormalities of gait and mobility: Secondary | ICD-10-CM

## 2021-05-26 DIAGNOSIS — R2681 Unsteadiness on feet: Secondary | ICD-10-CM

## 2021-05-26 DIAGNOSIS — M6281 Muscle weakness (generalized): Secondary | ICD-10-CM

## 2021-05-26 NOTE — Therapy (Signed)
[place], [place], [address] Phone: [phone]   Fax:  [phone]  Physical Therapy Treatment and Re-Certification  Patient Details  Name: Jamie Sparks MRN: [id number] Date of Birth: [birth year] Referring Provider (PT): Shelda Pal, [place]   Encounter Date: 05/26/2021   PT End of Session - 05/26/21 1147     Visit Number 5    Number of Visits 6    Date for PT Re-Evaluation 05/23/21    Authorization Type Medicare    PT Start Time 1147    PT Stop Time 1230    PT Time Calculation (min) 43 min    Equipment Utilized During Treatment Gait belt    Activity Tolerance Patient tolerated treatment well    Behavior During Therapy [facility] for tasks assessed/performed             Past Medical History:  Diagnosis Date   Hypertension     No past surgical history on file.  There were no vitals filed for this visit.   Subjective Assessment - 05/26/21 1148     Subjective Pt reports he cut his medicine down in half and feels he is moving better. Pt reports he has been doing the exercises.    Limitations Walking;House hold activities    How long can you sit comfortably? n/a    How long can you stand comfortably? n/a    How long can you walk comfortably? n/a    Patient Stated Goals Improve balance    Currently in Pain? No/denies                [facility] PT Assessment - 05/26/21 0001       Assessment   Medical Diagnosis R26.89 (ICD-10-CM) - Balance problem    Referring Provider (PT) Shelda Pal, DO    Prior Therapy Balance ~1 year ago      Precautions   Precautions Fall      Transfers   Five time sit to stand comments  16 sec      Ambulation/Gait   Assistive device None    Gait Pattern Step-through pattern;Decreased stride length;Abducted - left;Abducted- right    Ambulation Surface Unlevel;Outdoor;Paved    Gait Comments Outdoor amb working on maintaining  gait speed x4 along sidewalk ~100'      Berg Balance Test   Sit to Stand Able to stand without using hands and stabilize independently    Standing Unsupported Able to stand safely 2 minutes    Sitting with Back Unsupported but Feet Supported on Floor or Stool Able to sit safely and securely 2 minutes    Stand to Sit Sits safely with minimal use of hands    Transfers Able to transfer safely, minor use of hands    Standing Unsupported with Eyes Closed Able to stand 10 seconds safely    Standing Unsupported with Feet Together Able to place feet together independently and stand 1 minute safely    From Standing, Reach Forward with Outstretched Arm Can reach confidently >25 cm (10")    From Standing Position, Pick up Object from Floor Able to pick up shoe safely and easily    From Standing Position, Turn to Look Behind Over each Shoulder Looks behind from both sides and weight shifts well    Turn 360 Degrees Able to turn 360 degrees safely but slowly    Standing Unsupported, Alternately Place Feet on Step/Stool Able to stand independently and safely and complete 8  steps in 20 seconds    Standing Unsupported, One Foot in [place] to place foot tandem independently and hold 30 seconds    Standing on One Leg Able to lift leg independently and hold equal to or more than 3 seconds    Total Score 52    Berg comment: 52/56      Functional Gait  Assessment   Gait Level Surface Walks 20 ft in less than 7 sec but greater than 5.5 sec, uses assistive device, slower speed, mild gait deviations, or deviates 6-10 in outside of the 12 in walkway width.    Change in Gait Speed Able to change speed, demonstrates mild gait deviations, deviates 6-10 in outside of the 12 in walkway width, or no gait deviations, unable to achieve a major change in velocity, or uses a change in velocity, or uses an assistive device.    Gait with Horizontal Head Turns Performs head turns smoothly with no change in gait. Deviates no more  than 6 in outside 12 in walkway width    Gait with Vertical Head Turns Performs head turns with no change in gait. Deviates no more than 6 in outside 12 in walkway width.    Gait and Pivot Turn Pivot turns safely in greater than 3 sec and stops with no loss of balance, or pivot turns safely within 3 sec and stops with mild imbalance, requires small steps to catch balance.    Step Over Obstacle Is able to step over 2 stacked shoe boxes taped together (9 in total height) without changing gait speed. No evidence of imbalance.    Gait with Narrow Base of Support Ambulates 4-7 steps.    Gait with Eyes Closed Walks 20 ft, uses assistive device, slower speed, mild gait deviations, deviates 6-10 in outside 12 in walkway width. Ambulates 20 ft in less than 9 sec but greater than 7 sec.    Ambulating Backwards Walks 20 ft, uses assistive device, slower speed, mild gait deviations, deviates 6-10 in outside 12 in walkway width.    Steps Alternating feet, must use rail.   1 LOB   Total Score 22    FGA comment: 22/30                           [facility] Adult PT Treatment/Exercise - 05/26/21 0001       Knee/Hip Exercises: Seated   Sit to Sand 2 sets;10 reps;without UE support                 Balance Exercises - 05/26/21 0001       Balance Exercises: Standing   SLS Eyes open;2 reps;10 secs    Tandem Gait Forward;Retro;2 reps   by counter x10'   Other Standing Exercises Sport cord: forward, bacward, L & R walking x10 each    Other Standing Exercises Comments "grapevine" 2x10'                PT Education - 05/26/21 1216     Education Details Discussed for pt to talk about his medication with his PCP since pt halved it by himself at home. Discussed HEP updates    Person(s) Educated Patient    Methods Explanation;Demonstration;Tactile cues;Verbal cues;Handout    Comprehension Verbalized understanding;Returned demonstration;Verbal cues required;Tactile cues required                  PT Long Term Goals - 05/26/21 1243  PT LONG TERM GOAL #1   Title The patient will be indep with HEP for balance, LE strengthening, flexibility.    Baseline Mostly performs HEP but at times inconsistently 05/26/21    Time 4    Period Weeks    Status Partially Met    Target Date 06/23/21      PT LONG TERM GOAL #2   Title The patient will improve FGA from 14/30 to > or equal to 18/30 to demo improving dynamic balance control.    Baseline 22/30 on 05/26/21    Time 6    Period Weeks    Status Achieved    Target Date 05/09/21      PT LONG TERM GOAL #3   Title Pt will have improved Berg Balance Score to at least 52/56 to categorize as a low fall risk    Baseline 52/56 on 05/26/21    Time 6    Period Weeks    Status Achieved    Target Date 05/23/21      PT LONG TERM GOAL #4   Title Pt will demo improved functional hip strength with 5 x STS <15 sec    Baseline 16 sec on 05/26/21    Time 4    Period Weeks    Status Partially Met    Target Date 06/23/21      PT LONG TERM GOAL #5   Title Pt will have improved FOTO score to at least 98    Baseline 97    Time 6    Period Weeks    Status On-going    Target Date 05/23/21                   Plan - 05/26/21 1217     Clinical Impression Statement Rechecked pt's LTGs -- pt has been meeting or partially meeting them. Pt enjoyed sports cord and requested to work on reactive balance with it again. Pt has continued difficulty with single leg stability/balance. Decreased power noted with sit<>stand. Pt would benefit from a few more visits of PT to fully reach his goals and improve gait speed.    Personal Factors and Comorbidities Age;Time since onset of injury/illness/exacerbation;Fitness    Examination-Activity Limitations Stairs;Locomotion Level;Squat    Examination-Participation Restrictions Community Activity;Yard Work;Other   Recreational tasks   Stability/Clinical Decision Making Stable/Uncomplicated     Rehab Potential Good    PT Frequency 1x / week    PT Duration 4 weeks    PT Treatment/Interventions ADLs/Self Care Home Management;Aquatic Therapy;Cryotherapy;Electrical Stimulation;Moist Heat;Iontophoresis 62m/ml Dexamethasone;Gait training;Stair training;Functional mobility training;Therapeutic activities;Therapeutic exercise;Balance training;Neuromuscular re-education;Patient/family education;Manual techniques;Dry needling;Passive range of motion;Taping;Vestibular    PT Next Visit Plan Progress and modify HEP as able. Continue to work on power with sit to stand, tandem stance into single leg stability, weight shifting, dynamic gait    PT Home Exercise Plan Access Code: [email or web address]    Consulted and Agree with Plan of Care Patient             Patient will benefit from skilled therapeutic intervention in order to improve the following deficits and impairments:  Abnormal gait, Difficulty walking, Decreased balance, Decreased mobility, Decreased strength  Visit Diagnosis: Other abnormalities of gait and mobility  Unsteadiness on feet  Muscle weakness (generalized)     Problem List Patient Active Problem List   Diagnosis Date Noted   Chiari I malformation ([place] 02/25/2021   Mixed hyperlipidemia 08/13/2020   Essential hypertension 02/10/2020   Obesity (BMI 30.0-34.9) 02/11/2019  Sinusitis 09/21/2011   Right hip pain 06/23/2011   Right ankle pain 06/23/2011   Back pain 02/02/2011   Murmur 01/27/2011   Musculoskeletal leg pain 10/26/2010   HTN (hypertension) 10/26/2010    [facility] April Gordy Levan, PT, DPT 05/26/2021, 12:51 PM  [facility] [place], [place], [address] Phone: [phone]   Fax:  [phone]  Name: Jamie Sparks MRN: [id number] Date of Birth: [birth year]

## 2021-06-02 ENCOUNTER — Ambulatory Visit: Payer: Medicare Other | Admitting: Physical Therapy

## 2021-06-02 ENCOUNTER — Other Ambulatory Visit: Payer: Self-pay

## 2021-06-02 DIAGNOSIS — M6281 Muscle weakness (generalized): Secondary | ICD-10-CM

## 2021-06-02 DIAGNOSIS — R2681 Unsteadiness on feet: Secondary | ICD-10-CM

## 2021-06-02 DIAGNOSIS — R2689 Other abnormalities of gait and mobility: Secondary | ICD-10-CM

## 2021-06-02 NOTE — Therapy (Signed)
[place] °[facility] °[address] °, [place], [address] °Phone: [phone]   Fax:  [phone] ° °Physical Therapy Treatment ° °Patient Details  °Name: Jamie Sparks °MRN: [id number] °Date of Birth: 08/20/1955 °Referring Provider (PT): Wendling, Nicholas Paul, DO ° ° °Encounter Date: 06/02/2021 ° ° PT End of Session - 06/02/21 1145   ° ° Visit Number 6   ° Number of Visits 6   ° Date for PT Re-Evaluation 05/23/21   ° Authorization Type Medicare   ° PT Start Time 1145   ° PT Stop Time 1230   ° PT Time Calculation (min) 45 min   ° Equipment Utilized During Treatment Gait belt   ° Activity Tolerance Patient tolerated treatment well   ° Behavior During Therapy WFL for tasks assessed/performed   ° °  °  ° °  ° ° °Past Medical History:  °Diagnosis Date  ° Hypertension   ° ° °No past surgical history on file. ° °There were no vitals filed for this visit. ° ° Subjective Assessment - 06/02/21 1147   ° ° Subjective Pt states he went back to the full dose of his medicine but he feels he's been more shaky since then. Has continued to work on his exercises at home.   ° Limitations Walking;House hold activities   ° How long can you sit comfortably? n/a   ° How long can you stand comfortably? n/a   ° How long can you walk comfortably? n/a   ° Patient Stated Goals Improve balance   ° Currently in Pain? No/denies   ° °  °  ° °  ° ° ° ° ° [facility] PT Assessment - 06/02/21 0001   ° °  ° Transfers  ° Five time sit to stand comments  12 sec   ° °  °  ° °  ° ° ° ° ° ° ° ° ° ° ° ° ° ° ° ° [facility] Adult PT Treatment/Exercise - 06/02/21 0001   ° °  ° Knee/Hip Exercises: Standing  ° Other Standing Knee Exercises Anti rotation with lateral stepping green tband x10 each side   °  ° Knee/Hip Exercises: Seated  ° Sit to Sand 10 reps;without UE support   ° °  °  ° °  ° ° ° ° ° ° Balance Exercises - 06/02/21 0001   ° °  ° Balance Exercises: Standing  ° Standing Eyes Opened Narrow base of  support (BOS);Foam/compliant surface;Other (comment)   diagonal chops x10 left and right  ° SLS Eyes open;10 secs;5 reps   cues for core and hip lateral shift  ° Tandem Gait Forward;Retro;3 reps   next to counter  ° Other Standing Exercises toes on slant board x30 sec, head turns and head nods x10 each. Heel on slant board x 30 sec, head turns and head nods x 10 each   ° °  °  ° °  ° ° ° ° ° ° ° ° ° ° PT Long Term Goals - 06/02/21 1247   ° °  ° PT LONG TERM GOAL #1  ° Title The patient will be indep with HEP for balance, LE strengthening, flexibility.   ° Baseline Mostly performs HEP but at times inconsistently 05/26/21   ° Time 4   ° Period Weeks   ° Status Partially Met   ° Target Date 06/23/21   °  ° PT LONG TERM GOAL #2  ° Title The patient   will improve FGA from 14/30 to > or equal to 18/30 to demo improving dynamic balance control.    Baseline 22/30 on 05/26/21    Time 6    Period Weeks    Status Achieved    Target Date 05/09/21      PT LONG TERM GOAL #3   Title Pt will have improved Berg Balance Score to at least 52/56 to categorize as a low fall risk    Baseline 52/56 on 05/26/21    Time 6    Period Weeks    Status Achieved    Target Date 05/23/21      PT LONG TERM GOAL #4   Title Pt will demo improved functional hip strength with 5 x STS <15 sec    Baseline 16 sec on 05/26/21    Time 4    Period Weeks    Status Achieved    Target Date 06/23/21      PT LONG TERM GOAL #5   Title Pt will have improved FOTO score to at least 98    Baseline 97    Time 6    Period Weeks    Status On-going    Target Date 05/23/21                   Plan - 06/02/21 1245     Clinical Impression Statement Pt with improving power during sit<>stand -- able to decrease time to 12 sec this session. Continued difficulty with SLS. Noted that pt was not shifting his weight enough into leg for single leg stability. Session focused on trunk/core strengthening and control to increase weight shift without  LOB. PT will continue to progress pt as able.    Personal Factors and Comorbidities Age;Time since onset of injury/illness/exacerbation;Fitness    Examination-Activity Limitations Stairs;Locomotion Level;Squat    Examination-Participation Restrictions Community Activity;Yard Work;Other   Recreational tasks   Stability/Clinical Decision Making Stable/Uncomplicated    Rehab Potential Good    PT Frequency 1x / week    PT Duration 4 weeks    PT Treatment/Interventions ADLs/Self Care Home Management;Aquatic Therapy;Cryotherapy;Electrical Stimulation;Moist Heat;Iontophoresis 46m/ml Dexamethasone;Gait training;Stair training;Functional mobility training;Therapeutic activities;Therapeutic exercise;Balance training;Neuromuscular re-education;Patient/family education;Manual techniques;Dry needling;Passive range of motion;Taping;Vestibular    PT Next Visit Plan Progress and modify HEP as able. Continue to work on power with sit to stand, tandem stance into single leg stability, weight shifting, dynamic gait    PT Home Exercise Plan Access Code: [email or web address]    Consulted and Agree with Plan of Care Patient             Patient will benefit from skilled therapeutic intervention in order to improve the following deficits and impairments:  Abnormal gait, Difficulty walking, Decreased balance, Decreased mobility, Decreased strength  Visit Diagnosis: Other abnormalities of gait and mobility  Unsteadiness on feet  Muscle weakness (generalized)     Problem List Patient Active Problem List   Diagnosis Date Noted   Chiari I malformation ([place] 02/25/2021   Mixed hyperlipidemia 08/13/2020   Essential hypertension 02/10/2020   Obesity (BMI 30.0-34.9) 02/11/2019   Sinusitis 09/21/2011   Right hip pain 06/23/2011   Right ankle pain 06/23/2011   Back pain 02/02/2011   Murmur 01/27/2011   Musculoskeletal leg pain 10/26/2010   HTN (hypertension) 10/26/2010    GSt. Louis Psych[facility] Rehabilitation CenterApril MGordy Levan PT,  DPT 06/02/2021, 12:51 PM  [facility] [address]Phone: [phone]  Fax:  [phone]  Name: Jamie Sparks MRN: [id number] Date of Birth: [birth year]

## 2021-06-09 ENCOUNTER — Other Ambulatory Visit: Payer: Self-pay

## 2021-06-09 ENCOUNTER — Ambulatory Visit: Payer: Medicare Other | Attending: Family Medicine | Admitting: Physical Therapy

## 2021-06-09 ENCOUNTER — Telehealth: Payer: Self-pay | Admitting: Family Medicine

## 2021-06-09 DIAGNOSIS — R2681 Unsteadiness on feet: Secondary | ICD-10-CM | POA: Insufficient documentation

## 2021-06-09 DIAGNOSIS — R2689 Other abnormalities of gait and mobility: Secondary | ICD-10-CM | POA: Diagnosis not present

## 2021-06-09 DIAGNOSIS — M6281 Muscle weakness (generalized): Secondary | ICD-10-CM | POA: Diagnosis present

## 2021-06-09 MED ORDER — AMLODIPINE BESYLATE 10 MG PO TABS: 10.0000 mg | ORAL_TABLET | Freq: Every day | ORAL | 2 refills | Status: DC

## 2021-06-09 NOTE — Telephone Encounter (Signed)
Medication: amLODipine (NORVASC) 10 MG tablet  Has the patient contacted their pharmacy? No.  Preferred Pharmacy: [id number] - [place], [place] - [address]  [place], [place] [address]  Phone:  [phone]  Fax:  [phone]

## 2021-06-09 NOTE — Therapy (Signed)
[place], [place], [address] Phone: [phone]   Fax:  [phone]  Physical Therapy Treatment  Patient Details  Name: Jamie Sparks MRN: [id number] Date of Birth: 02/19/1956 Referring Provider (PT): Jamie Sparks, [place]   Encounter Date: 06/09/2021   PT End of Session - 06/09/21 1449     Visit Number 7    Date for PT Re-Evaluation 06/23/21    Authorization Type Medicare    PT Start Time 1449    PT Stop Time 1530    PT Time Calculation (min) 41 min    Equipment Utilized During Treatment Gait belt    Activity Tolerance Patient tolerated treatment well    Behavior During Therapy [facility] for tasks assessed/performed             Past Medical History:  Diagnosis Date   Hypertension     No past surgical history on file.  There were no vitals filed for this visit.   Subjective Assessment - 06/09/21 1452     Subjective Pt states he feels his balance is not so good today. Did talk to his doctor about changing his balance. Pt states that the one leg balance is still problematic but the other exercises are good.    Limitations Walking;House hold activities    How long can you sit comfortably? n/a    How long can you stand comfortably? n/a    How long can you walk comfortably? n/a    Patient Stated Goals Improve balance    Currently in Pain? No/denies                               [facility] Adult PT Treatment/Exercise - 06/09/21 0001       Ambulation/Gait   Ambulation Distance (Feet) 115 Feet    Assistive device None    Gait Pattern Step-through pattern;Decreased stride length;Abducted - left;Abducted- right    Ambulation Surface Level;Indoor    Gait velocity 4.5 ft/sec      Knee/Hip Exercises: Aerobic   Tread Mill warm up 1.9 mph x 5 min   cues to try and maintain speed without UE support                Balance Exercises - 06/09/21 0001        Balance Exercises: Standing   SLS Eyes open;5 reps;20 secs    Rebounder Static;Tandem;10 reps   narrow stance with side throw x10 each side   Marching Solid surface;20 reps   first set with no arm movement, 2nd set with UEs tapping ball on counter during SLS with marching                    PT Long Term Goals - 06/09/21 1530       PT LONG TERM GOAL #1   Title The patient will be indep with HEP for balance, LE strengthening, flexibility.    Baseline Mostly performs HEP but at times inconsistently 05/26/21    Time 4    Period Weeks    Status Partially Met    Target Date 06/23/21      PT LONG TERM GOAL #2   Title The patient will improve FGA from 14/30 to > or equal to 18/30 to demo improving dynamic balance control.    Baseline 22/30 on 05/26/21    Time 6    Period Weeks  Status Achieved    Target Date 05/09/21      PT LONG TERM GOAL #3   Title Pt will have improved Berg Balance Score to at least 52/56 to categorize as a low fall risk    Baseline 52/56 on 05/26/21    Time 6    Period Weeks    Status Achieved    Target Date 05/23/21      PT LONG TERM GOAL #4   Title Pt will demo improved functional hip strength with 5 x STS <15 sec    Baseline 16 sec on 05/26/21    Time 4    Period Weeks    Status Achieved    Target Date 06/23/21      PT LONG TERM GOAL #5   Title Pt will have improved FOTO score to at least 98    Baseline 97    Time 6    Period Weeks    Status Not Met    Target Date 05/23/21                   Plan - 06/09/21 1527     Clinical Impression Statement Pt with improved single leg stance. Able to tolerate >30 sec on R LE. Continuing to work on weight shifting in single leg stance/narrow base. Plan for d/Jamie next session. Pt has a rockerboard at home -- discussed going through exercises he can do with it.    Personal Factors and Comorbidities Age;Time since onset of injury/illness/exacerbation;Fitness    Examination-Activity Limitations  Stairs;Locomotion Level;Squat    Examination-Participation Restrictions Community Activity;Yard Work;Other   Recreational tasks   Stability/Clinical Decision Making Stable/Uncomplicated    Rehab Potential Good    PT Frequency 1x / week    PT Duration 4 weeks    PT Treatment/Interventions ADLs/Self Care Home Management;Aquatic Therapy;Cryotherapy;Electrical Stimulation;Moist Heat;Iontophoresis 61m/ml Dexamethasone;Gait training;Stair training;Functional mobility training;Therapeutic activities;Therapeutic exercise;Balance training;Neuromuscular re-education;Patient/family education;Manual techniques;Dry needling;Passive range of motion;Taping;Vestibular    PT Next Visit Plan Progress and modify HEP as able. Continue to work on power with sit to stand, tandem stance into single leg stability, weight shifting, dynamic gait    PT Home Exercise Plan Access Code: [email or web address]    Consulted and Agree with Plan of Care Patient             Patient will benefit from skilled therapeutic intervention in order to improve the following deficits and impairments:  Abnormal gait, Difficulty walking, Decreased balance, Decreased mobility, Decreased strength  Visit Diagnosis: Other abnormalities of gait and mobility  Unsteadiness on feet  Muscle weakness (generalized)     Problem List Patient Active Problem List   Diagnosis Date Noted   Chiari I malformation ([place] 02/25/2021   Mixed hyperlipidemia 08/13/2020   Essential hypertension 02/10/2020   Obesity (BMI 30.0-34.9) 02/11/2019   Sinusitis 09/21/2011   Right hip pain 06/23/2011   Right ankle pain 06/23/2011   Back pain 02/02/2011   Murmur 01/27/2011   Musculoskeletal leg pain 10/26/2010   HTN (hypertension) 10/26/2010    GOhio [facility] Hospital EastApril MGordy Sparks PT, DPT 06/09/2021, 3:33 PM  [facility] [address]Phone: [phone]  Fax:  [phone] Name:  Jamie RAMROOPMRN: [id number] of Birth: [birth year]

## 2021-06-09 NOTE — Telephone Encounter (Signed)
Rx sent 

## 2021-06-10 MED ORDER — AMLODIPINE BESYLATE 5 MG PO TABS: 5.0000 mg | ORAL_TABLET | Freq: Every day | ORAL | 3 refills | Status: DC

## 2021-06-10 NOTE — Addendum Note (Signed)
Addended by: Sharon Seller B on: 06/10/2021 01:27 PM   Modules accepted: Orders

## 2021-06-10 NOTE — Telephone Encounter (Signed)
Updated list Sent in 5 mg. Called the patient informed of PCP instructions.

## 2021-06-10 NOTE — Telephone Encounter (Signed)
Patient state he would like to change it from 10mg  to 5mg s. He states he has been taking physical therapy and he's been having issues, and the PT mentioned that it might be due to the high dose. Please advise.

## 2021-06-13 ENCOUNTER — Other Ambulatory Visit: Payer: Self-pay | Admitting: Family Medicine

## 2021-06-16 ENCOUNTER — Other Ambulatory Visit: Payer: Self-pay

## 2021-06-16 ENCOUNTER — Ambulatory Visit: Payer: Medicare Other | Admitting: Physical Therapy

## 2021-06-16 DIAGNOSIS — R2689 Other abnormalities of gait and mobility: Secondary | ICD-10-CM | POA: Diagnosis not present

## 2021-06-16 DIAGNOSIS — M6281 Muscle weakness (generalized): Secondary | ICD-10-CM

## 2021-06-16 DIAGNOSIS — R2681 Unsteadiness on feet: Secondary | ICD-10-CM

## 2021-06-16 NOTE — Therapy (Signed)
[place], [place], [address] Phone: [phone]   Fax:  [phone]  Physical Therapy Treatment and Discharge  Patient Details  Name: Jamie Sparks MRN: [id number] Date of Birth: [birth year] Referring Provider (PT): Shelda Pal, DO  PHYSICAL THERAPY DISCHARGE SUMMARY  Visits from Start of Care: 8  Current functional level related to goals / functional outcomes: See below   Remaining deficits: Continues to have some difficulty with controlling weight shift and increasing his weight shift L<>R for single leg stance   Education / Equipment: Discussed final HEP and what else pt needs to work on   Patient agrees to discharge. Patient goals were met. Patient is being discharged due to meeting the stated rehab goals.   Encounter Date: 06/16/2021   PT End of Session - 06/16/21 0800     Visit Number 8    Date for PT Re-Evaluation 06/23/21    Authorization Type Medicare    PT Start Time 0801    PT Stop Time 0845    PT Time Calculation (min) 44 min    Equipment Utilized During Treatment Gait belt    Activity Tolerance Patient tolerated treatment well    Behavior During Therapy WFL for tasks assessed/performed             Past Medical History:  Diagnosis Date   Hypertension     No past surgical history on file.  There were no vitals filed for this visit.   Subjective Assessment - 06/16/21 0803     Subjective Pt states he talked to his doctor and he was agreeable to decrease his medication dosage. Pt feels pretty good today.    Limitations Walking;House hold activities    How long can you sit comfortably? n/a    How long can you stand comfortably? n/a    How long can you walk comfortably? n/a    Patient Stated Goals Improve balance                [facility] PT Assessment - 06/16/21 0001       Assessment   Medical Diagnosis R26.89 (ICD-10-CM) - Balance  problem    Referring Provider (PT) Nani Ravens, Crosby Oyster, DO      Berg Balance Test   Sit to Stand Able to stand without using hands and stabilize independently    Standing Unsupported Able to stand safely 2 minutes    Sitting with Back Unsupported but Feet Supported on Floor or Stool Able to sit safely and securely 2 minutes    Stand to Sit Sits safely with minimal use of hands    Transfers Able to transfer safely, minor use of hands    Standing Unsupported with Eyes Closed Able to stand 10 seconds safely    Standing Unsupported with Feet Together Able to place feet together independently and stand 1 minute safely    From Standing, Reach Forward with Outstretched Arm Can reach confidently >25 cm (10")    From Standing Position, Pick up Object from Floor Able to pick up shoe safely and easily    From Standing Position, Turn to Look Behind Over each Shoulder Looks behind from both sides and weight shifts well    Turn 360 Degrees Able to turn 360 degrees safely but slowly    Standing Unsupported, Alternately Place Feet on Step/Stool Able to stand independently and safely and complete 8 steps in 20 seconds    Standing Unsupported, One Foot in Front  Able to place foot tandem independently and hold 30 seconds    Standing on One Leg Able to lift leg independently and hold 5-10 seconds    Total Score 53    Berg comment: 53/56      Functional Gait  Assessment   Gait Level Surface Walks 20 ft in less than 7 sec but greater than 5.5 sec, uses assistive device, slower speed, mild gait deviations, or deviates 6-10 in outside of the 12 in walkway width.    Change in Gait Speed Able to smoothly change walking speed without loss of balance or gait deviation. Deviate no more than 6 in outside of the 12 in walkway width.    Gait with Horizontal Head Turns Performs head turns smoothly with no change in gait. Deviates no more than 6 in outside 12 in walkway width    Gait with Vertical Head Turns Performs  head turns with no change in gait. Deviates no more than 6 in outside 12 in walkway width.    Gait and Pivot Turn Pivot turns safely in greater than 3 sec and stops with no loss of balance, or pivot turns safely within 3 sec and stops with mild imbalance, requires small steps to catch balance.    Step Over Obstacle Is able to step over 2 stacked shoe boxes taped together (9 in total height) without changing gait speed. No evidence of imbalance.    Gait with Narrow Base of Support Ambulates 4-7 steps.    Gait with Eyes Closed Walks 20 ft, no assistive devices, good speed, no evidence of imbalance, normal gait pattern, deviates no more than 6 in outside 12 in walkway width. Ambulates 20 ft in less than 7 sec.    Ambulating Backwards Walks 20 ft, no assistive devices, good speed, no evidence for imbalance, normal gait    Steps Alternating feet, no rail.    Total Score 26    FGA comment: 26/30                           [facility] Adult PT Treatment/Exercise - 06/16/21 0001       Knee/Hip Exercises: Aerobic   Tread Mill treadmill 1.9 mph without UE support x 5 min                 Balance Exercises - 06/16/21 0001       Balance Exercises: Standing   Tandem Stance Eyes open;30 secs   with alternating UEs   SLS Eyes open;2 reps;10 secs    Rockerboard Anterior/posterior;Lateral;EO   5x10 sec holds left, center and R. 3x5 sec holds ant, center, post   Tandem Gait Forward;3 reps    Marching Solid surface;20 reps   2x10                    PT Long Term Goals - 06/16/21 0806       PT LONG TERM GOAL #1   Title The patient will be indep with HEP for balance, LE strengthening, flexibility.    Baseline Mostly performs HEP but at times inconsistently 05/26/21    Time 4    Period Weeks    Status Achieved    Target Date 06/23/21      PT LONG TERM GOAL #2   Title The patient will improve FGA from 14/30 to > or equal to 18/30 to demo improving dynamic balance control.     Baseline 22/30 on 05/26/21; 26/30  on 06/16/21    Time 6    Period Weeks    Status Achieved    Target Date 05/09/21      PT LONG TERM GOAL #3   Title Pt will have improved Berg Balance Score to at least 52/56 to categorize as a low fall risk    Baseline 52/56 on 05/26/21; 53/56 on 06/16/21    Time 6    Period Weeks    Status Achieved    Target Date 05/23/21      PT LONG TERM GOAL #4   Title Pt will demo improved functional hip strength with 5 x STS <15 sec    Baseline 16 sec on 05/26/21    Time 4    Period Weeks    Status Achieved    Target Date 06/23/21      PT LONG TERM GOAL #5   Title Pt will have improved FOTO score to at least 98    Baseline 97    Time 6    Period Weeks    Status Not Met    Target Date 05/23/21                   Plan - 06/16/21 0845     Clinical Impression Statement Pt with improving weight shifting and balance. Remains inconsistent -- at times he will not shift his weight enough. Discussed what he can still work on to further improve. Trialed rockerboard exercises today with some pt discomfort/fear of fall. Pt has met all of his LTGs and is ready for PT d/c.    Personal Factors and Comorbidities Age;Time since onset of injury/illness/exacerbation;Fitness    Examination-Activity Limitations Stairs;Locomotion Level;Squat    Examination-Participation Restrictions Community Activity;Yard Work;Other   Recreational tasks   Stability/Clinical Decision Making Stable/Uncomplicated    Rehab Potential Good    PT Frequency 1x / week    PT Duration 4 weeks    PT Treatment/Interventions ADLs/Self Care Home Management;Aquatic Therapy;Cryotherapy;Electrical Stimulation;Moist Heat;Iontophoresis 81m/ml Dexamethasone;Gait training;Stair training;Functional mobility training;Therapeutic activities;Therapeutic exercise;Balance training;Neuromuscular re-education;Patient/family education;Manual techniques;Dry needling;Passive range of motion;Taping;Vestibular    PT  Next Visit Plan Progress and modify HEP as able. Continue to work on power with sit to stand, tandem stance into single leg stability, weight shifting, dynamic gait    PT Home Exercise Plan Access Code: [email or web address]    Consulted and Agree with Plan of Care Patient             Patient will benefit from skilled therapeutic intervention in order to improve the following deficits and impairments:  Abnormal gait, Difficulty walking, Decreased balance, Decreased mobility, Decreased strength  Visit Diagnosis: Other abnormalities of gait and mobility  Unsteadiness on feet  Muscle weakness (generalized)     Problem List Patient Active Problem List   Diagnosis Date Noted   Chiari I malformation ([place] 02/25/2021   Mixed hyperlipidemia 08/13/2020   Essential hypertension 02/10/2020   Obesity (BMI 30.0-34.9) 02/11/2019   Sinusitis 09/21/2011   Right hip pain 06/23/2011   Right ankle pain 06/23/2011   Back pain 02/02/2011   Murmur 01/27/2011   Musculoskeletal leg pain 10/26/2010   HTN (hypertension) 10/26/2010    [facility] CenterApril MGordy Levan PT, DPT 06/16/2021, 9:29 AM  [facility]1[place] [address] [address]Phone: [phone]  Fax:  [phone] Name: CSHYNE LEHRKEMRN: [id number] of Birth: [birth year]

## 2021-06-22 ENCOUNTER — Other Ambulatory Visit: Payer: Medicare Other

## 2021-07-04 ENCOUNTER — Other Ambulatory Visit (INDEPENDENT_AMBULATORY_CARE_PROVIDER_SITE_OTHER): Payer: Medicare Other

## 2021-07-04 DIAGNOSIS — E782 Mixed hyperlipidemia: Secondary | ICD-10-CM | POA: Diagnosis not present

## 2021-07-04 DIAGNOSIS — R7303 Prediabetes: Secondary | ICD-10-CM | POA: Diagnosis not present

## 2021-07-04 DIAGNOSIS — E785 Hyperlipidemia, unspecified: Secondary | ICD-10-CM | POA: Diagnosis not present

## 2021-07-04 LAB — HEMOGLOBIN A1C: Hgb A1c MFr Bld: 5.9 % (ref 4.6–6.5)

## 2021-07-04 LAB — HEPATIC FUNCTION PANEL
ALT: 15 U/L (ref 0–53)
AST: 18 U/L (ref 0–37)
Albumin: 4.1 g/dL (ref 3.5–5.2)
Alkaline Phosphatase: 76 U/L (ref 39–117)
Bilirubin, Direct: 0.1 mg/dL (ref 0.0–0.3)
Total Bilirubin: 0.6 mg/dL (ref 0.2–1.2)
Total Protein: 6.9 g/dL (ref 6.0–8.3)

## 2021-07-04 LAB — LIPID PANEL
Cholesterol: 164 mg/dL (ref 0–200)
HDL: 39.8 mg/dL (ref >39.00)
LDL Cholesterol: 89 mg/dL (ref 0–99)
NonHDL: 124.05
Total CHOL/HDL Ratio: 4
Triglycerides: 176 mg/dL — ABNORMAL HIGH (ref 0.0–149.0)
VLDL: 35.2 mg/dL (ref 0.0–40.0)

## 2021-08-06 ENCOUNTER — Other Ambulatory Visit: Payer: Self-pay | Admitting: Family Medicine

## 2021-08-28 ENCOUNTER — Other Ambulatory Visit: Payer: Self-pay | Admitting: Family Medicine

## 2021-11-11 ENCOUNTER — Encounter: Payer: Self-pay | Admitting: Family Medicine

## 2021-11-11 ENCOUNTER — Ambulatory Visit (INDEPENDENT_AMBULATORY_CARE_PROVIDER_SITE_OTHER): Payer: Medicare Other | Admitting: Family Medicine

## 2021-11-11 VITALS — BP 146/92 | HR 78 | Temp 97.8°F | Ht 72.0 in | Wt 244.5 lb

## 2021-11-11 DIAGNOSIS — I10 Essential (primary) hypertension: Secondary | ICD-10-CM

## 2021-11-11 DIAGNOSIS — N401 Enlarged prostate with lower urinary tract symptoms: Secondary | ICD-10-CM | POA: Diagnosis not present

## 2021-11-11 DIAGNOSIS — R351 Nocturia: Secondary | ICD-10-CM | POA: Diagnosis not present

## 2021-11-11 MED ORDER — DOXAZOSIN MESYLATE 2 MG PO TABS: 2.0000 mg | ORAL_TABLET | Freq: Every day | ORAL | 2 refills | Status: DC

## 2021-11-11 NOTE — Progress Notes (Signed)
Chief Complaint  Patient presents with   Follow-up    Blood pressure Frequent urination     Subjective Jamie Sparks is a 66 y.o. male who presents for hypertension follow up. He does not monitor home blood pressures. He is compliant with medication- Norvasc 5 mg/d. Patient has these side effects of medication: freq urination over last 3 mo; goes every hr at night. Was on Cardura in past, stopped as we thought he was having balance issues, which turned out not to be the case.  He is adhering to a healthy diet overall. Current exercise: cycling, walking, rowing No Cp or SOB.    Past Medical History:  Diagnosis Date   Hypertension     Exam BP (!) 146/92   Pulse 78   Temp 97.8 F (36.6 C) (Oral)   Ht 6' (1.829 m)   Wt 244 lb 8 oz (110.9 kg)   SpO2 97%   BMI 33.16 kg/m  General:  well developed, well nourished, in no apparent distress Heart: RRR, no bruits, no LE edema Lungs: clear to auscultation, no accessory muscle use Abd: BS+, S, NT, ND Psych: well oriented with normal range of affect and appropriate judgment/insight  Essential hypertension - Plan: doxazosin (CARDURA) 2 MG tablet  BPH associated with nocturia - Plan: doxazosin (CARDURA) 2 MG tablet  Chronic, not controlled. Cont Norvasc 5 mg/d. Add Cardura 2 mg/d. Counseled on diet and exercise. Start checking BP at home again. F/u in 1 mo.  Chronic, uncontrolled. Not likely AE from med given tempo. Will see how Cardura does. Could consider Finasteride as add'n.  The patient voiced understanding and agreement to the plan.  Jilda Roche [place], DO 11/11/21  7:14 AM

## 2021-11-11 NOTE — Patient Instructions (Signed)
Check your blood pressures 2-3 times per week, alternating the time of day you check it. If it is high, considering waiting 1-2 minutes and rechecking. If it gets higher, your anxiety is likely creeping up and we should avoid rechecking.   Stay on your other medications.   Keep the diet clean and stay active.  Let us know if you need anything.

## 2021-11-14 ENCOUNTER — Telehealth: Payer: Self-pay | Admitting: Family Medicine

## 2021-11-14 MED ORDER — AMLODIPINE BESYLATE 5 MG PO TABS: 5.0000 mg | ORAL_TABLET | Freq: Every day | ORAL | 2 refills | Status: DC

## 2021-11-14 NOTE — Telephone Encounter (Signed)
Refill done.  

## 2021-11-14 NOTE — Telephone Encounter (Signed)
Amlodipine 5 mg is on his list, but message requesting 10 mg?

## 2021-11-14 NOTE — Telephone Encounter (Signed)
We should be on 5 mg/d. Decreased dosage back in Feb. Ty.

## 2021-11-14 NOTE — Telephone Encounter (Signed)
Medication:  amLODipine (NORVASC) 10 MG tablet  Has the patient contacted their pharmacy? No. Patient would like 10 mgs  Preferred Pharmacy (with phone number or street name):  [id number] [place], [place] - [address]  [address],  [place] [address]  Phone:  [phone]  Fax:  [phone]   Agent: Please be advised that RX refills may take up to 3 business days. We ask that you follow-up with your pharmacy.

## 2021-11-15 MED ORDER — AMLODIPINE BESYLATE 10 MG PO TABS: 10.0000 mg | ORAL_TABLET | Freq: Every day | ORAL | 3 refills | Status: DC

## 2021-11-15 NOTE — Telephone Encounter (Signed)
Prescription sent and wife informed

## 2021-11-15 NOTE — Addendum Note (Signed)
Addended by: Scharlene Gloss B on: 11/15/2021 10:45 AM   Modules accepted: Orders

## 2021-11-15 NOTE — Telephone Encounter (Signed)
Pt's wife stated husband was supposed to talk with pcp about upping medication. She stated sometimes he leaves out things w/o meaning to, but his bp has been elevated and he has felt a little woozy. When he take another dose of the 5mg  he feels much better. Pt's wife stated they are more thank willing to make an appt to discuss this further if needed. Please advise.

## 2021-12-02 ENCOUNTER — Telehealth: Payer: Self-pay | Admitting: Family Medicine

## 2021-12-02 MED ORDER — ATORVASTATIN CALCIUM 10 MG PO TABS: 10.0000 mg | ORAL_TABLET | Freq: Every day | ORAL | 1 refills | Status: DC

## 2021-12-02 NOTE — Telephone Encounter (Signed)
Refill done.  

## 2021-12-02 NOTE — Telephone Encounter (Signed)
Medication atorvastatin (LIPITOR) 10 MG tablet [[id number]  (If no, request that the patient contact the pharmacy for the refill.) (If yes, when and what did the pharmacy advise?)  Preferred Pharmacy (with phone number or street name): [phone] [place], [place] - [address]  [address], [place] [address]   Agent: Please be advised that RX refills may take up to 3 business days. We ask that you follow-up with your pharmacy.

## 2022-01-03 ENCOUNTER — Encounter: Payer: Self-pay | Admitting: Family Medicine

## 2022-01-03 ENCOUNTER — Ambulatory Visit (INDEPENDENT_AMBULATORY_CARE_PROVIDER_SITE_OTHER): Payer: Medicare Other | Admitting: Family Medicine

## 2022-01-03 VITALS — BP 142/88 | HR 94 | Temp 98.2°F | Ht 72.0 in | Wt 243.2 lb

## 2022-01-03 DIAGNOSIS — I10 Essential (primary) hypertension: Secondary | ICD-10-CM

## 2022-01-03 MED ORDER — LISINOPRIL 20 MG PO TABS: 20.0000 mg | ORAL_TABLET | Freq: Every day | ORAL | 3 refills | Status: DC

## 2022-01-03 NOTE — Patient Instructions (Addendum)
Keep the diet clean and stay active.  Check your blood pressures 2-3 times per week, alternating the time of day you check it. If it is high, considering waiting 1-2 minutes and rechecking. If it gets higher, your anxiety is likely creeping up and we should avoid rechecking.   Let us know if you need anything.  

## 2022-01-03 NOTE — Progress Notes (Signed)
Chief Complaint  Patient presents with   Follow-up    Problem with medication     Subjective Jamie Sparks is a 66 y.o. male who presents for hypertension follow up. He does not monitor home blood pressures. He is compliant with medication-amlodipine 10 mg daily, Cardura 2 mg daily. Patient has these side effects of medication: He feels the amlodipine is making him more mentally confused.  He vaguely remembers this happening the last time he was on it. He is adhering to a healthy diet overall. Current exercise: lifting weights, cardio No CP or SOB.   Past Medical History:  Diagnosis Date   Hypertension     Exam BP (!) 142/88 (BP Location: Left Arm, Cuff Size: Normal)   Pulse 94   Temp 98.2 F (36.8 C) (Oral)   Ht 6' (1.829 m)   Wt 243 lb 4 oz (110.3 kg)   SpO2 97%   BMI 32.99 kg/m  General:  well developed, well nourished, in no apparent distress Heart: RRR, no bruits, no LE edema Lungs: clear to auscultation, no accessory muscle use Psych: well oriented with normal range of affect and appropriate judgment/insight  Essential hypertension - Plan: lisinopril (ZESTRIL) 20 MG tablet  Adverse effect of chronic medication for chronic issue.  Stop amlodipine, add lisinopril 20 mg daily.  He will continue Cardura 2 mg daily.  He is still urinating frequently but mainly during the day.  I offered a daily overactive bladder treatment which he politely declined at this time. F/u in 1 month to recheck blood pressure, will have to monitor BMP at that visit as well. The patient voiced understanding and agreement to the plan.  Jilda Roche [place], DO 01/03/22  11:58 AM

## 2022-02-03 ENCOUNTER — Ambulatory Visit (INDEPENDENT_AMBULATORY_CARE_PROVIDER_SITE_OTHER): Payer: Medicare Other | Admitting: Family Medicine

## 2022-02-03 ENCOUNTER — Encounter: Payer: Self-pay | Admitting: Family Medicine

## 2022-02-03 VITALS — BP 138/92 | HR 82 | Temp 98.2°F | Ht 72.0 in | Wt 244.5 lb

## 2022-02-03 DIAGNOSIS — I10 Essential (primary) hypertension: Secondary | ICD-10-CM | POA: Diagnosis not present

## 2022-02-03 DIAGNOSIS — N401 Enlarged prostate with lower urinary tract symptoms: Secondary | ICD-10-CM

## 2022-02-03 DIAGNOSIS — R351 Nocturia: Secondary | ICD-10-CM

## 2022-02-03 MED ORDER — FINASTERIDE 5 MG PO TABS: 5.0000 mg | ORAL_TABLET | Freq: Every day | ORAL | 2 refills | Status: DC

## 2022-02-03 MED ORDER — LISINOPRIL 40 MG PO TABS: 40.0000 mg | ORAL_TABLET | Freq: Every day | ORAL | 3 refills | Status: DC

## 2022-02-03 NOTE — Progress Notes (Signed)
Chief Complaint  Patient presents with   Follow-up    Urinating more than usual    Subjective: Patient is a 66 y.o. male here for f/u freq urination.  Started on Cardura 2 mg/d. Failed Flomax in past. Urinating freq throughout day in addition to nightly. Will urinate 3 times per night on average. Urinates once every other hour. No pain, bleeding, constipation, N/V, discharge. Does not consume large amounts of caffeine or alcohol. He is no longer on a diuretic.   HTN- Cardura 2 mg/d, lisinopril 20 mg/d. Compliant, no AE's. Diet is fair. Cycles and lift wts routinely. No Cp or SOB. Does not routinely monitor BP at home.   Past Medical History:  Diagnosis Date   Hypertension     Objective: BP (!) 138/92 (BP Location: Left Arm, Cuff Size: Normal)   Pulse 82   Temp 98.2 F (36.8 C) (Oral)   Ht 6' (1.829 m)   Wt 244 lb 8 oz (110.9 kg)   SpO2 98%   BMI 33.16 kg/m  General: Awake, appears stated age Heart: RRR, no LE edema Lungs: CTAB, no rales, wheezes or rhonchi. No accessory muscle use Abd: BS+, S, NT, ND Psych: Age appropriate judgment and insight, normal affect and mood  Assessment and Plan: BPH associated with nocturia - Plan: finasteride (PROSCAR) 5 MG tablet  Essential hypertension - Plan: lisinopril (ZESTRIL) 40 MG tablet  Chronic, uncontrolled. Cont Cardura 2 mg/d. Start Proscar 5 mg/d.  Chronic, uncontrolled. Increase lisinopril to 40 mg/d. Monitor BP at home. Counseled on diet/exercise. F/u in 1 mo.  The patient voiced understanding and agreement to the plan.  [place], DO 02/03/22  9:43 AM

## 2022-02-03 NOTE — Patient Instructions (Signed)
Keep the diet clean and stay active.  Let me know if there are cost issues.  Stay on the Lipitor and Cardura as is for now.  Check your blood pressures 2-3 times per week, alternating the time of day you check it. If it is high, considering waiting 1-2 minutes and rechecking. If it gets higher, your anxiety is likely creeping up and we should avoid rechecking.   Let us know if you need anything.

## 2022-02-05 ENCOUNTER — Other Ambulatory Visit: Payer: Self-pay | Admitting: Family Medicine

## 2022-02-05 DIAGNOSIS — N401 Enlarged prostate with lower urinary tract symptoms: Secondary | ICD-10-CM

## 2022-02-05 DIAGNOSIS — I10 Essential (primary) hypertension: Secondary | ICD-10-CM

## 2022-02-12 ENCOUNTER — Other Ambulatory Visit: Payer: Self-pay | Admitting: Family Medicine

## 2022-03-06 ENCOUNTER — Encounter: Payer: Self-pay | Admitting: Family Medicine

## 2022-03-06 ENCOUNTER — Telehealth: Payer: Self-pay | Admitting: Family Medicine

## 2022-03-06 ENCOUNTER — Ambulatory Visit (INDEPENDENT_AMBULATORY_CARE_PROVIDER_SITE_OTHER): Payer: Medicare Other | Admitting: Family Medicine

## 2022-03-06 VITALS — BP 162/98 | HR 84 | Temp 98.6°F | Ht 72.0 in | Wt 246.5 lb

## 2022-03-06 DIAGNOSIS — I10 Essential (primary) hypertension: Secondary | ICD-10-CM | POA: Diagnosis not present

## 2022-03-06 DIAGNOSIS — Z23 Encounter for immunization: Secondary | ICD-10-CM | POA: Diagnosis not present

## 2022-03-06 MED ORDER — LOSARTAN POTASSIUM-HCTZ 50-12.5 MG PO TABS: 1.0000 | ORAL_TABLET | Freq: Every day | ORAL | 3 refills | Status: DC

## 2022-03-06 NOTE — Progress Notes (Signed)
Chief Complaint  Patient presents with   Follow-up    Subjective Jamie Sparks is a 66 y.o. male who presents for hypertension follow up. He does not monitor home blood pressures. He is compliant with medications. Patient has these side effects of medication: having depth perception when driving He is usually adhering to a healthy diet overall. Current exercise: lifting wts, cycling, walking No CP or SOB.    Past Medical History:  Diagnosis Date   Hypertension     Exam BP (!) 162/98 (BP Location: Left Arm, Cuff Size: Normal)   Pulse 84   Temp 98.6 F (37 C) (Oral)   Ht 6' (1.829 m)   Wt 246 lb 8 oz (111.8 kg)   SpO2 97%   BMI 33.43 kg/m  General:  well developed, well nourished, in no apparent distress Heart: RRR, no bruits, no LE edema Lungs: clear to auscultation, no accessory muscle use Psych: well oriented with normal range of affect and appropriate judgment/insight  Essential hypertension - Plan: losartan-hydrochlorothiazide (HYZAAR) 50-12.5 MG tablet  Chronic, uncontrolled. Stop lisinopril Start Hyzaar 50-12.5 mg/d. He had the best response to thiazide diuretics previously but we stopped thinking it was causing freq urination. I think that is more prostate at this point and he is politely declining urology referral. He will let me know if he changes his mind.  Counseled on diet and exercise. Flu shot today.  F/u in 1 mo. He did not do well with Norvasc in the past. Would titrate his meds up if he does OK vs adding Toprol XL.  The patient voiced understanding and agreement to the plan.  [place], DO 03/06/22  9:53 AM

## 2022-03-06 NOTE — Addendum Note (Signed)
Addended by: Sharon Seller B on: 03/06/2022 10:00 AM   Modules accepted: Orders

## 2022-03-06 NOTE — Telephone Encounter (Signed)
Called the wife informed of PCP instructions.  

## 2022-03-06 NOTE — Patient Instructions (Addendum)
Keep the diet clean and stay active.  Check your blood pressures 2-3 times per week, alternating the time of day you check it. If it is high, considering waiting 1-2 minutes and rechecking. If it gets higher, your anxiety is likely creeping up and we should avoid rechecking.   Let me now if you are interested in having a sleep study.   Let us know if you need anything.

## 2022-03-06 NOTE — Telephone Encounter (Signed)
Yes, continue that. We stopped the other one. His med list from visit is accurate.

## 2022-03-06 NOTE — Telephone Encounter (Signed)
Maudie Mercury (wife DPR OK) called stating that she was unsure if pt should still be taking doxazosin still. Please advise

## 2022-03-21 ENCOUNTER — Encounter: Payer: Self-pay | Admitting: Family Medicine

## 2022-03-21 ENCOUNTER — Ambulatory Visit (INDEPENDENT_AMBULATORY_CARE_PROVIDER_SITE_OTHER): Payer: Medicare Other | Admitting: Family Medicine

## 2022-03-21 VITALS — BP 144/94 | HR 74 | Temp 98.6°F | Ht 72.0 in | Wt 246.1 lb

## 2022-03-21 DIAGNOSIS — I10 Essential (primary) hypertension: Secondary | ICD-10-CM | POA: Diagnosis not present

## 2022-03-21 LAB — BASIC METABOLIC PANEL
BUN: 16 mg/dL (ref 6–23)
CO2: 29 mEq/L (ref 19–32)
Calcium: 8.8 mg/dL (ref 8.4–10.5)
Chloride: 102 mEq/L (ref 96–112)
Creatinine, Ser: 1.07 mg/dL (ref 0.40–1.50)
GFR: 71.69 mL/min (ref >60.00)
Glucose, Bld: 92 mg/dL (ref 70–99)
Potassium: 4.3 mEq/L (ref 3.5–5.1)
Sodium: 139 mEq/L (ref 135–145)

## 2022-03-21 MED ORDER — LOSARTAN POTASSIUM-HCTZ 100-25 MG PO TABS: 1.0000 | ORAL_TABLET | Freq: Every day | ORAL | 2 refills | Status: DC

## 2022-03-21 NOTE — Progress Notes (Signed)
Chief Complaint  Patient presents with   Follow-up    Subjective Jamie Sparks is a 66 y.o. male who presents for hypertension follow up. He does not monitor home blood pressures. He is compliant with medications- Hyzaar 50-12.5 mg/d, Cardura 2 mg/d. Patient has these side effects of medication: none He is adhering to a healthy diet overall. Current exercise: cycling, lifting wts No Cp or SOB.    Past Medical History:  Diagnosis Date   Hypertension     Exam BP (!) 144/94 (BP Location: Left Arm, Cuff Size: Normal)   Pulse 74   Temp 98.6 F (37 C) (Oral)   Ht 6' (1.829 m)   Wt 246 lb 2 oz (111.6 kg)   SpO2 96%   BMI 33.38 kg/m  General:  well developed, well nourished, in no apparent distress Heart: RRR, no bruits, no LE edema Lungs: clear to auscultation, no accessory muscle use Psych: well oriented with normal range of affect and appropriate judgment/insight  Essential hypertension - Plan: Basic metabolic panel, losartan-hydrochlorothiazide (HYZAAR) 100-25 MG tablet  Chronic, unstable. Increase Hyzaar from 50-12.5 mg/d to 100-25 mg/d. Cont Cardura 2 mg/d. Monitor BP at home. Having some balance issues, needs to increase his protein intake.  Counseled on diet and exercise. F/u in 1 mo. The patient voiced understanding and agreement to the plan.  Jilda Roche [place], DO 03/21/22  10:51 AM

## 2022-03-21 NOTE — Patient Instructions (Addendum)
Keep the diet clean and stay active.  I think we need to increase our protein intake. Try to get in around 100-125 g of protein per day.   Check your blood pressures 2-3 times per week, alternating the time of day you check it. If it is high, considering waiting 1-2 minutes and rechecking. If it gets higher, your anxiety is likely creeping up and we should avoid rechecking.   Let us know if you need anything.

## 2022-04-19 ENCOUNTER — Encounter: Payer: Self-pay | Admitting: Family Medicine

## 2022-04-19 ENCOUNTER — Ambulatory Visit (INDEPENDENT_AMBULATORY_CARE_PROVIDER_SITE_OTHER): Payer: Medicare Other | Admitting: Family Medicine

## 2022-04-19 VITALS — BP 134/88 | HR 80 | Temp 98.4°F | Ht 72.0 in | Wt 247.2 lb

## 2022-04-19 DIAGNOSIS — I10 Essential (primary) hypertension: Secondary | ICD-10-CM | POA: Diagnosis not present

## 2022-04-19 MED ORDER — SPIRONOLACTONE 25 MG PO TABS: 25.0000 mg | ORAL_TABLET | Freq: Every day | ORAL | 3 refills | Status: DC

## 2022-04-19 MED ORDER — LOSARTAN POTASSIUM-HCTZ 50-12.5 MG PO TABS: 1.0000 | ORAL_TABLET | Freq: Every day | ORAL | 2 refills | Status: DC

## 2022-04-19 NOTE — Progress Notes (Signed)
Chief Complaint  Patient presents with   Follow-up    1 month    Subjective Jamie Sparks is a 66 y.o. male who presents for hypertension follow up. He does not monitor home blood pressures. He is compliant with medications- Hyzaar 100-25 mg/d, Cardura 2 mg daily. Patient has these side effects of medication: feels "off" mid afternoon since starting medication He is adhering to a healthy diet overall. Current exercise: lifting, cycling, walking No CP or SOB.    Past Medical History:  Diagnosis Date   Hypertension     Exam BP 134/88 (BP Location: Left Arm, Cuff Size: Normal)   Pulse 80   Temp 98.4 F (36.9 C) (Oral)   Ht 6' (1.829 m)   Wt 247 lb 4 oz (112.2 kg)   SpO2 98%   BMI 33.53 kg/m  General:  well developed, well nourished, in no apparent distress Heart: RRR, no bruits, no LE edema Lungs: clear to auscultation, no accessory muscle use Psych: well oriented with normal range of affect and appropriate judgment/insight  Essential hypertension - Plan: spironolactone (ALDACTONE) 25 MG tablet, losartan-hydrochlorothiazide (HYZAAR) 50-12.5 MG tablet  Adverse effect of medication.  We will decrease the Hyzaar back to 50-12.5 mg daily.  Add spironolactone 25 mg daily.  Continue Cardura 2 mg daily.  Check labs in 1 week.  I will see him in 1 month.  Counseled on diet and exercise. The patient voiced understanding and agreement to the plan.  Jilda Roche [place], DO 04/19/22  11:03 AM

## 2022-04-19 NOTE — Patient Instructions (Signed)
Keep the diet clean and stay active.  Let us know if you need anything. 

## 2022-04-26 ENCOUNTER — Telehealth: Payer: Self-pay | Admitting: Family Medicine

## 2022-04-26 ENCOUNTER — Other Ambulatory Visit: Payer: Self-pay | Admitting: Family Medicine

## 2022-04-26 ENCOUNTER — Other Ambulatory Visit (INDEPENDENT_AMBULATORY_CARE_PROVIDER_SITE_OTHER): Payer: Medicare Other

## 2022-04-26 DIAGNOSIS — I10 Essential (primary) hypertension: Secondary | ICD-10-CM

## 2022-04-26 LAB — BASIC METABOLIC PANEL
BUN: 18 mg/dL (ref 6–23)
CO2: 28 mEq/L (ref 19–32)
Calcium: 9.3 mg/dL (ref 8.4–10.5)
Chloride: 101 mEq/L (ref 96–112)
Creatinine, Ser: 1.25 mg/dL (ref 0.40–1.50)
GFR: 59.45 mL/min — ABNORMAL LOW (ref >60.00)
Glucose, Bld: 97 mg/dL (ref 70–99)
Potassium: 4.4 mEq/L (ref 3.5–5.1)
Sodium: 139 mEq/L (ref 135–145)

## 2022-04-26 NOTE — Progress Notes (Signed)
BMP added per Dr. Drue Novel from office visit 04/19/2022

## 2022-04-26 NOTE — Telephone Encounter (Signed)
Left message for patient to call back and schedule Medicare Annual Wellness Visit (AWV) in office.   Please offer to do virtually or by telephone.   Last AWV: 03/21/2021   Please schedule at any time with [facility] Nurse Health Advisor 2  30 minute appointment for Virtual or phone  45 minute appointment for Initial virtual/phone  Any questions, please contact me at [phone]   Thank you,   [facility]  Ambulatory Clinical Support for Care Management [facility] You Are. We Are. [facility] ??[phone] or ??[phone]

## 2022-05-31 ENCOUNTER — Other Ambulatory Visit: Payer: Self-pay | Admitting: Family Medicine

## 2022-05-31 ENCOUNTER — Encounter: Payer: Self-pay | Admitting: Family Medicine

## 2022-05-31 ENCOUNTER — Ambulatory Visit (INDEPENDENT_AMBULATORY_CARE_PROVIDER_SITE_OTHER): Payer: Medicare Other | Admitting: Family Medicine

## 2022-05-31 VITALS — BP 146/82 | HR 88 | Temp 98.0°F | Resp 18 | Ht 72.0 in | Wt 254.8 lb

## 2022-05-31 DIAGNOSIS — I10 Essential (primary) hypertension: Secondary | ICD-10-CM

## 2022-05-31 MED ORDER — CLOBETASOL PROPIONATE 0.05 % EX OINT: 1.0000 | TOPICAL_OINTMENT | Freq: Two times a day (BID) | CUTANEOUS | 2 refills | Status: AC

## 2022-05-31 NOTE — Patient Instructions (Addendum)
Keep the diet clean and stay active.  Focus on improving miles on your bike and I think your weight and blood pressure will come down accordingly. If we are not able to do this in the next month or so, please let me know as we will likely need to adjust medication.   Let us know if you need anything.

## 2022-05-31 NOTE — Progress Notes (Signed)
Chief Complaint  Patient presents with   Hypertension   Follow-up    Subjective Jamie Sparks is a 67 y.o. male who presents for hypertension follow up. He does not monitor home blood pressures. He is compliant with medications- Hyzaar 50-12.5 mg/d, spironolactone 25 mg/d, Cardura 2 mg/d. Patient has these side effects of medication: none He has a longstanding hx of prominent AE's with various BP medications.  He is adhering to a healthy diet overall. Current exercise: lifting wts, some cycling No CP or SOB.    Past Medical History:  Diagnosis Date   Hypertension     Exam BP (!) 146/82 (BP Location: Left Arm, Cuff Size: Large)   Pulse 88   Temp 98 F (36.7 C) (Oral)   Resp 18   Ht 6' (1.829 m)   Wt 254 lb 12.8 oz (115.6 kg)   SpO2 98%   BMI 34.56 kg/m  General:  well developed, well nourished, in no apparent distress Heart: RRR, no bruits, 1+ pitting bl LE edema tapering at mid tibia Lungs: clear to auscultation, no accessory muscle use Psych: well oriented with normal range of affect and appropriate judgment/insight  Essential hypertension  Chronic, unstable. Cont Hyzaar 50-12/5 mg/d spironolactone 25 mg/d, Cardura 2 mg/d. Counseled on diet and exercise. He is going to focus on putting more miles on his trainer which should help drop his weight and BP as it has historically. If he is not able to reasonably do this, he will let me know and I will adjust he medication and follow up in the office.  F/u in 6 mo. The patient voiced understanding and agreement to the plan.  [place], DO 05/31/22  10:34 AM

## 2022-06-13 ENCOUNTER — Telehealth: Payer: Self-pay | Admitting: Family Medicine

## 2022-06-13 NOTE — Telephone Encounter (Signed)
Copied from [place] [id number]. Topic: Medicare AWV >> Jun 13, 2022  9:05 AM Gillis Santa wrote: Reason for CRM: LVM PATIENT TO CALL [phone] [place] TELE VISIT

## 2022-06-14 ENCOUNTER — Encounter: Payer: Self-pay | Admitting: Family Medicine

## 2022-06-14 ENCOUNTER — Ambulatory Visit (INDEPENDENT_AMBULATORY_CARE_PROVIDER_SITE_OTHER): Payer: Medicare Other | Admitting: Family Medicine

## 2022-06-14 VITALS — BP 128/86 | HR 98 | Temp 97.0°F | Ht 72.0 in | Wt 254.4 lb

## 2022-06-14 DIAGNOSIS — H6993 Unspecified Eustachian tube disorder, bilateral: Secondary | ICD-10-CM

## 2022-06-14 MED ORDER — FLUTICASONE PROPIONATE 50 MCG/ACT NA SUSP: 2.0000 | Freq: Every day | NASAL | 6 refills | Status: AC

## 2022-06-14 NOTE — Patient Instructions (Signed)
Claritin (loratadine), Allegra (fexofenadine), Zyrtec (cetirizine) which is also equivalent to Xyzal (levocetirizine); these are listed in order from weakest to strongest. Generic, and therefore cheaper, options are in the parentheses.  ? ?Flonase (fluticasone); nasal spray that is over the counter. 2 sprays each nostril, once daily. Aim towards the same side eye when you spray. ? ?There are available OTC, and the generic versions, which may be cheaper, are in parentheses. Show this to a pharmacist if you have trouble finding any of these items. ? ?Let us know if you need anything. ?

## 2022-06-14 NOTE — Progress Notes (Signed)
Chief Complaint  Patient presents with   ears    Subjective: Patient is a 67 y.o. male here for balance issues.  Worsening over past few days. Eating/drinking normally. No med changes. No palpitations, N/V/D, weakness, vision changes, difficulty with speech or with swallowing. Hx of allergies.   Past Medical History:  Diagnosis Date   Hypertension     Objective: BP 128/86 (BP Location: Left Arm, Cuff Size: Large)   Pulse 98   Temp (!) 97 F (36.1 C) (Oral)   Ht 6' (1.829 m)   Wt 254 lb 6 oz (115.4 kg)   SpO2 98%   BMI 34.50 kg/m  General: Awake, appears stated age HEENT: TM's retracted bl, no fluid or erythema, canals patent w/o otorrhea Heart: RRR Lungs: CTAB, no rales, wheezes or rhonchi. No accessory muscle use Neuro: Gait is slow and cautious.  Psych: Age appropriate judgment and insight, normal affect and mood  Assessment and Plan: Dysfunction of both eustachian tubes - Plan: fluticasone (FLONASE) 50 MCG/ACT nasal spray  INCS. Stay hydrated and active. Send message if no improvement.  The patient voiced understanding and agreement to the plan.  [place], DO 06/14/22  1:37 PM

## 2022-06-15 ENCOUNTER — Other Ambulatory Visit: Payer: Self-pay | Admitting: Family Medicine

## 2022-06-15 DIAGNOSIS — I10 Essential (primary) hypertension: Secondary | ICD-10-CM

## 2022-07-15 ENCOUNTER — Other Ambulatory Visit: Payer: Self-pay | Admitting: Family Medicine

## 2022-07-15 DIAGNOSIS — I10 Essential (primary) hypertension: Secondary | ICD-10-CM

## 2022-07-19 ENCOUNTER — Telehealth: Payer: Self-pay | Admitting: Family Medicine

## 2022-07-19 NOTE — Telephone Encounter (Signed)
Copied from [place] [id number]. Topic: Medicare AWV >> Jul 19, 2022  2:41 PM Devoria Glassing wrote: Reason for CRM: Called patient to schedule Medicare Annual Wellness Visit (AWV). Left message for patient to call back and schedule Medicare Annual Wellness Visit (AWV).  Last date of AWV: 03/21/2021   Please schedule an appointment at any time with Beatris Ship, [place] .  If any questions, please contact me.  Thank you ,  Sherol [place]; [place] Direct Dial: [phone]

## 2022-08-11 ENCOUNTER — Encounter: Payer: Self-pay | Admitting: Family Medicine

## 2022-08-11 ENCOUNTER — Ambulatory Visit: Payer: Medicare Other | Admitting: Family Medicine

## 2022-08-11 VITALS — BP 146/76 | HR 84 | Temp 98.2°F | Ht 72.0 in | Wt 254.4 lb

## 2022-08-11 DIAGNOSIS — R202 Paresthesia of skin: Secondary | ICD-10-CM | POA: Diagnosis not present

## 2022-08-11 DIAGNOSIS — I10 Essential (primary) hypertension: Secondary | ICD-10-CM | POA: Diagnosis not present

## 2022-08-11 LAB — CBC
HCT: 41.5 % (ref 39.0–52.0)
Hemoglobin: 14 g/dL (ref 13.0–17.0)
MCHC: 33.7 g/dL (ref 30.0–36.0)
MCV: 87 fl (ref 78.0–100.0)
Platelets: 250 10*3/uL (ref 150.0–400.0)
RBC: 4.78 Mil/uL (ref 4.22–5.81)
RDW: 14 % (ref 11.5–15.5)
WBC: 6.2 10*3/uL (ref 4.0–10.5)

## 2022-08-11 LAB — BASIC METABOLIC PANEL
BUN: 18 mg/dL (ref 6–23)
CO2: 28 mEq/L (ref 19–32)
Calcium: 9.1 mg/dL (ref 8.4–10.5)
Chloride: 103 mEq/L (ref 96–112)
Creatinine, Ser: 1.23 mg/dL (ref 0.40–1.50)
GFR: 60.49 mL/min (ref >60.00)
Glucose, Bld: 125 mg/dL — ABNORMAL HIGH (ref 70–99)
Potassium: 4.3 mEq/L (ref 3.5–5.1)
Sodium: 138 mEq/L (ref 135–145)

## 2022-08-11 LAB — MAGNESIUM: Magnesium: 2.2 mg/dL (ref 1.5–2.5)

## 2022-08-11 MED ORDER — LABETALOL HCL 200 MG PO TABS
200.0000 mg | ORAL_TABLET | Freq: Two times a day (BID) | ORAL | 1 refills | Status: DC
Start: 2022-08-11 — End: 2022-09-04

## 2022-08-11 NOTE — Patient Instructions (Addendum)
Keep the diet clean and stay active.  Check your blood pressures 2-3 times per week, alternating the time of day you check it. If it is high, considering waiting 1-2 minutes and rechecking. If it gets higher, your anxiety is likely creeping up and we should avoid rechecking.   Let us know if you need anything.  

## 2022-08-11 NOTE — Progress Notes (Signed)
Chief Complaint  Patient presents with   Follow-up    Medication problem with Losartan Feet numbness    Subjective Jamie Sparks is a 67 y.o. male who presents for hypertension follow up. He does not monitor home blood pressures. He is compliant with medications- Hyzaar 50-12.5 mg/d, spironolactone 25 mg/d, Cardura 2 mg/d. Patient has these side effects of medication: had tingling in feet with losartan-hctz He is usually adhering to a healthy diet overall. Current exercise: cycling, lifting wts, walking. No CP or SOB.    Past Medical History:  Diagnosis Date   Hypertension     Exam BP (!) 146/76 (BP Location: Left Arm, Cuff Size: Large)   Pulse 84   Temp 98.2 F (36.8 C) (Oral)   Ht 6' (1.829 m)   Wt 254 lb 6 oz (115.4 kg)   SpO2 97%   BMI 34.50 kg/m  General:  well developed, well nourished, in no apparent distress Heart: RRR, no bruits, no LE edema Lungs: clear to auscultation, no accessory muscle use Psych: well oriented with normal range of affect and appropriate judgment/insight  Essential hypertension - Plan: labetalol (NORMODYNE) 200 MG tablet  Paresthesias - Plan: CBC, Basic metabolic panel, Magnesium  Chronic, uncontrolled. Cont Cardua 2 mg/d, spironolactone 25 mg/d. Stop Hyzaar 2/2 AE's- balance issues and memory difficulties. Start labetalol 200 mg bid. He has been on Coreg and Toprol in past. Would leave BB class if no better. He has failed Norvasc 2/2 balance issues on several occasions. Monitor BP at home. Counseled on diet and exercise. F/u in 1 mo. If not controlled, would consider hydralazine.  The patient voiced understanding and agreement to the plan.  Jilda Roche [place], DO 08/11/22  9:23 AM

## 2022-09-02 ENCOUNTER — Other Ambulatory Visit: Payer: Self-pay | Admitting: Family Medicine

## 2022-09-02 DIAGNOSIS — I10 Essential (primary) hypertension: Secondary | ICD-10-CM

## 2022-09-04 ENCOUNTER — Encounter: Payer: Self-pay | Admitting: Family Medicine

## 2022-09-04 ENCOUNTER — Ambulatory Visit (INDEPENDENT_AMBULATORY_CARE_PROVIDER_SITE_OTHER): Payer: Medicare Other | Admitting: Family Medicine

## 2022-09-04 VITALS — BP 152/76 | HR 71 | Temp 98.2°F | Ht 72.0 in | Wt 255.1 lb

## 2022-09-04 DIAGNOSIS — R2689 Other abnormalities of gait and mobility: Secondary | ICD-10-CM | POA: Diagnosis not present

## 2022-09-04 NOTE — Patient Instructions (Addendum)
If you do not hear anything about your referral in the next 1-2 weeks, call our office and ask for an update.  Stay hydrated.  Let us know if you need anything. 

## 2022-09-04 NOTE — Progress Notes (Signed)
Chief Complaint  Patient presents with   Balance problems    Subjective: Patient is a 67 y.o. male here for fall.  3 d ago, stood up after pulling lawnmower bag out and moving it and fell backwards.  He did not have any lightheadedness or dizziness.  He did not hit his head or lose consciousness.  He has not had any issues since then.  Walking has been a struggle from a balance standpoint for the past several years.  He saw neurology and physical therapy.  He notes the physical therapy was very rudimentary with none resisted leg extensions which was less intensity than what he does in the gym on his own.  Imaging did show advanced atrophy of the cerebellum.  He is eating and drinking normally.  No recent illness.  No nausea, vomiting, diarrhea.  Past Medical History:  Diagnosis Date   Hypertension     Objective: BP (!) 152/76 (BP Location: Left Arm, Cuff Size: Large)   Pulse 71   Temp 98.2 F (36.8 C) (Oral)   Ht 6' (1.829 m)   Wt 255 lb 2 oz (115.7 kg)   SpO2 97%   BMI 34.60 kg/m  General: Awake, appears stated age Heart: RRR, no LE edema Lungs: CTAB, no rales, wheezes or rhonchi. No accessory muscle use Neuro: DTRs equal and symmetric throughout, no clonus, no cerebellar signs, gait is slow and cautious.  5/5 strength throughout Mouth: MMM Eyes: Sclera white, PERRLA, EOMI Psych: Age appropriate judgment and insight, normal affect and mood  Assessment and Plan: Balance problem - Plan: Ambulatory referral to Physical Therapy  Refer back to physical therapy.  No obvious care malformation noted on imaging but could consider referral to neurosurgery if no improvement with PT.  Stay hydrated.  He will monitor blood pressure at home.  Noted he was pretty concerned about possibilities for today. The patient voiced understanding and agreement to the plan.  Jilda Roche [place], DO 09/04/22  10:35 AM

## 2022-09-11 ENCOUNTER — Ambulatory Visit: Payer: BLUE CROSS/BLUE SHIELD | Admitting: Family Medicine

## 2022-11-29 ENCOUNTER — Ambulatory Visit: Payer: BLUE CROSS/BLUE SHIELD | Admitting: Family Medicine

## 2022-12-06 ENCOUNTER — Encounter: Payer: Self-pay | Admitting: Family Medicine

## 2022-12-06 ENCOUNTER — Ambulatory Visit (INDEPENDENT_AMBULATORY_CARE_PROVIDER_SITE_OTHER): Payer: Medicare Other | Admitting: Family Medicine

## 2022-12-06 VITALS — BP 124/78 | HR 82 | Temp 99.0°F | Ht 72.0 in | Wt 247.5 lb

## 2022-12-06 DIAGNOSIS — Z1211 Encounter for screening for malignant neoplasm of colon: Secondary | ICD-10-CM | POA: Diagnosis not present

## 2022-12-06 DIAGNOSIS — R7303 Prediabetes: Secondary | ICD-10-CM | POA: Diagnosis not present

## 2022-12-06 DIAGNOSIS — E782 Mixed hyperlipidemia: Secondary | ICD-10-CM | POA: Diagnosis not present

## 2022-12-06 DIAGNOSIS — R2681 Unsteadiness on feet: Secondary | ICD-10-CM

## 2022-12-06 DIAGNOSIS — I10 Essential (primary) hypertension: Secondary | ICD-10-CM | POA: Diagnosis not present

## 2022-12-06 LAB — LDL CHOLESTEROL, DIRECT: Direct LDL: 119 mg/dL

## 2022-12-06 LAB — COMPREHENSIVE METABOLIC PANEL
ALT: 18 U/L (ref 0–53)
AST: 18 U/L (ref 0–37)
Albumin: 4.1 g/dL (ref 3.5–5.2)
Alkaline Phosphatase: 77 U/L (ref 39–117)
BUN: 22 mg/dL (ref 6–23)
CO2: 29 mEq/L (ref 19–32)
Calcium: 9.3 mg/dL (ref 8.4–10.5)
Chloride: 105 mEq/L (ref 96–112)
Creatinine, Ser: 1.32 mg/dL (ref 0.40–1.50)
GFR: 55.45 mL/min — ABNORMAL LOW (ref >60.00)
Glucose, Bld: 103 mg/dL — ABNORMAL HIGH (ref 70–99)
Potassium: 4.9 mEq/L (ref 3.5–5.1)
Sodium: 140 mEq/L (ref 135–145)
Total Bilirubin: 0.6 mg/dL (ref 0.2–1.2)
Total Protein: 6.7 g/dL (ref 6.0–8.3)

## 2022-12-06 LAB — LIPID PANEL
Cholesterol: 193 mg/dL (ref 0–200)
HDL: 34.9 mg/dL — ABNORMAL LOW (ref >39.00)
NonHDL: 158.25
Total CHOL/HDL Ratio: 6
Triglycerides: 206 mg/dL — ABNORMAL HIGH (ref 0.0–149.0)
VLDL: 41.2 mg/dL — ABNORMAL HIGH (ref 0.0–40.0)

## 2022-12-06 LAB — HEMOGLOBIN A1C: Hgb A1c MFr Bld: 6.1 % (ref 4.6–6.5)

## 2022-12-06 NOTE — Progress Notes (Signed)
Chief Complaint  Patient presents with   Follow-up    6 month Had a fall 2 weeks ago     Subjective Jamie Sparks is a 67 y.o. male who presents for hypertension follow up. He does not monitor home blood pressures. He is compliant with medications-Cardura 2 mg daily, labetalol 200 mg twice daily, Aldactone 25 mg daily. Patient has these side effects of medication: none He is adhering to a healthy diet overall. Current exercise: Cycling, lifting weights No CP or SOB.   Mixed Hyperlipidemia Patient presents for mixed hyperlipidemia follow up. Currently being treated with Lipitor 10 mg daily and compliance with treatment thus far has been good. He denies myalgias. Diet/exercise as above The patient is not known to have coexisting coronary artery disease.  Patient has chronic issues with balance. Larey Seat a couple weeks ago. Has been to PT, neuro, had scans that were all normal. Feels his balance is fine, walking slowly. No pain.    Past Medical History:  Diagnosis Date   Hypertension     Exam BP 124/78 (BP Location: Left Arm, Patient Position: Sitting, Cuff Size: Normal)   Pulse 82   Temp 99 F (37.2 C) (Oral)   Ht 6' (1.829 m)   Wt 247 lb 8 oz (112.3 kg)   SpO2 98%   BMI 33.57 kg/m  General:  well developed, well nourished, in no apparent distress Heart: RRR, no bruits, no LE edema Lungs: clear to auscultation, no accessory muscle use Neuro: Gait is slow/cautious.  Psych: well oriented with normal range of affect and appropriate judgment/insight  Essential hypertension  Mixed hyperlipidemia - Plan: Lipid panel, Comprehensive metabolic panel  Prediabetes - Plan: Hemoglobin A1c  Colon cancer screening - Plan: Cologuard  Gait instability  Chronic, stable.  Continue Cardura 2 mg daily, labetalol 200 mg twice daily, Aldactone 25 mg daily.  Counseled on diet and exercise. Chronic, stable.  Continue Lipitor 10 mg daily. Check A1c. Colon cancer screening as  above. Reports improvement. Offered 2nd opinion to neuro if he worsens again.  F/u in 6 mo or prn. The patient voiced understanding and agreement to the plan.  Jilda Roche [place], DO 12/06/22  12:52 PM

## 2022-12-06 NOTE — Patient Instructions (Addendum)
Give [place] 2-3 business days to get the results of your labs back.   Keep the diet clean and stay active.  Do high cadence intervals 30-60 seconds at a time and do a total of 4 sets within your cycling.   Let us know if you need anything.

## 2023-03-07 ENCOUNTER — Telehealth: Payer: Self-pay

## 2023-03-07 NOTE — Telephone Encounter (Signed)
Called pt lvm asking him he still ok to do  Cologuard, ask him mail it back.

## 2023-06-08 ENCOUNTER — Ambulatory Visit: Payer: Medicare Other | Admitting: Family Medicine

## 2023-10-22 ENCOUNTER — Telehealth: Payer: Self-pay | Admitting: Family Medicine

## 2023-10-22 NOTE — Telephone Encounter (Signed)
 Copied from CRM [id number]. Topic: Medicare AWV >> Oct 22, 2023  1:27 PM Juliana Ocean wrote: Reason for CRM: LVM 10/22/2023 to schedule AWV. Please schedule Virtual or Telehealth visits ONLY.   Jamie Sparks; Care Guide Ambulatory Clinical Support [place] [facility] Direct Dial: [phone]

## 2023-10-22 NOTE — Telephone Encounter (Signed)
 Copied from CRM [id number]. Topic: Medicare AWV >> Oct 22, 2023  1:34 PM Juliana Ocean wrote: Reason for CRM:LVM 10/22/2023 to schedule AWV. Please schedule Virtual or Telehealth visits ONLY.   Rosalee Collins; Care Guide Ambulatory Clinical Support [place] [facility] Direct Dial: [phone]

## 2023-12-07 ENCOUNTER — Telehealth: Payer: Self-pay

## 2023-12-07 NOTE — Telephone Encounter (Signed)
 Sent message about Cologuard
